# Patient Record
Sex: Female | Born: 1970 | Race: White | Hispanic: No | Smoking: Never smoker
Health system: Southern US, Community
[De-identification: ages and names within clinical notes are randomized; demographics above are authoritative.]

## PROBLEM LIST (undated history)

## (undated) DIAGNOSIS — M1990 Unspecified osteoarthritis, unspecified site: Secondary | ICD-10-CM

## (undated) DIAGNOSIS — K219 Gastro-esophageal reflux disease without esophagitis: Secondary | ICD-10-CM

## (undated) DIAGNOSIS — S329XXA Fracture of unspecified parts of lumbosacral spine and pelvis, initial encounter for closed fracture: Secondary | ICD-10-CM

## (undated) HISTORY — PX: WISDOM TOOTH EXTRACTION: SHX21

---
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
## 2007-01-05 ENCOUNTER — Ambulatory Visit (HOSPITAL_COMMUNITY): Admission: RE | Admit: 2007-01-05 | Discharge: 2007-01-05 | Payer: Self-pay | Admitting: Family Medicine

## 2007-01-12 ENCOUNTER — Ambulatory Visit (HOSPITAL_COMMUNITY): Admission: RE | Admit: 2007-01-12 | Discharge: 2007-01-12 | Payer: Self-pay | Admitting: Family Medicine

## 2009-02-17 ENCOUNTER — Other Ambulatory Visit: Admission: RE | Admit: 2009-02-17 | Discharge: 2009-02-17 | Payer: Self-pay | Admitting: Obstetrics and Gynecology

## 2010-03-08 ENCOUNTER — Emergency Department (HOSPITAL_COMMUNITY)
Admission: EM | Admit: 2010-03-08 | Discharge: 2010-03-08 | Disposition: A | Discharge status: Home / Self Care | Payer: Commercial Managed Care - PPO | Attending: Emergency Medicine | Admitting: Emergency Medicine

## 2010-03-08 DIAGNOSIS — R110 Nausea: Secondary | ICD-10-CM | POA: Insufficient documentation

## 2010-03-08 DIAGNOSIS — R1013 Epigastric pain: Secondary | ICD-10-CM | POA: Insufficient documentation

## 2010-03-08 DIAGNOSIS — K219 Gastro-esophageal reflux disease without esophagitis: Secondary | ICD-10-CM | POA: Insufficient documentation

## 2010-03-08 DIAGNOSIS — R10816 Epigastric abdominal tenderness: Secondary | ICD-10-CM | POA: Insufficient documentation

## 2010-03-08 LAB — COMPREHENSIVE METABOLIC PANEL
ALT: 24 U/L (ref 0–35)
AST: 39 U/L — ABNORMAL HIGH (ref 0–37)
Alkaline Phosphatase: 78 U/L (ref 39–117)
CO2: 24 mEq/L (ref 19–32)
Creatinine, Ser: 0.85 mg/dL (ref 0.4–1.2)
GFR calc Af Amer: >60 mL/min (ref >60)
Total Bilirubin: 0.3 mg/dL (ref 0.3–1.2)

## 2010-03-08 LAB — DIFFERENTIAL
Basophils Relative: 0 % (ref 0–1)
Lymphocytes Relative: 12 % (ref 12–46)
Lymphs Abs: 1.2 10*3/uL (ref 0.7–4.0)
Neutro Abs: 7.6 10*3/uL (ref 1.7–7.7)
Neutrophils Relative %: 81 % — ABNORMAL HIGH (ref 43–77)

## 2010-03-08 LAB — CBC
HCT: 38.5 % (ref 36.0–46.0)
MCH: 29.6 pg (ref 26.0–34.0)
MCHC: 34.3 g/dL (ref 30.0–36.0)
MCV: 86.3 fL (ref 78.0–100.0)
WBC: 9.3 10*3/uL (ref 4.0–10.5)

## 2010-03-08 LAB — POCT CARDIAC MARKERS
CKMB, poc: <1 ng/mL — ABNORMAL LOW (ref 1.0–8.0)
Troponin i, poc: <0.05 ng/mL (ref 0.00–0.09)
Troponin i, poc: <0.05 ng/mL (ref 0.00–0.09)

## 2013-09-05 ENCOUNTER — Other Ambulatory Visit (HOSPITAL_COMMUNITY): Payer: Self-pay | Admitting: Physician Assistant

## 2013-09-05 DIAGNOSIS — R1013 Epigastric pain: Secondary | ICD-10-CM

## 2013-09-12 ENCOUNTER — Ambulatory Visit (HOSPITAL_COMMUNITY)
Admission: RE | Admit: 2013-09-12 | Discharge: 2013-09-12 | Disposition: A | Discharge status: Home / Self Care | Payer: BC Managed Care – PPO | Source: Ambulatory Visit | Attending: Physician Assistant | Admitting: Physician Assistant

## 2013-09-12 DIAGNOSIS — K8020 Calculus of gallbladder without cholecystitis without obstruction: Secondary | ICD-10-CM | POA: Insufficient documentation

## 2013-09-12 DIAGNOSIS — R1013 Epigastric pain: Secondary | ICD-10-CM | POA: Diagnosis present

## 2013-09-12 DIAGNOSIS — R160 Hepatomegaly, not elsewhere classified: Secondary | ICD-10-CM | POA: Insufficient documentation

## 2014-03-25 ENCOUNTER — Other Ambulatory Visit (HOSPITAL_COMMUNITY): Payer: Self-pay | Admitting: Family Medicine

## 2014-03-25 DIAGNOSIS — N23 Unspecified renal colic: Secondary | ICD-10-CM

## 2014-03-25 DIAGNOSIS — K8050 Calculus of bile duct without cholangitis or cholecystitis without obstruction: Secondary | ICD-10-CM

## 2014-03-25 DIAGNOSIS — N2889 Other specified disorders of kidney and ureter: Secondary | ICD-10-CM

## 2014-03-27 ENCOUNTER — Ambulatory Visit (HOSPITAL_COMMUNITY)
Admission: RE | Admit: 2014-03-27 | Discharge: 2014-03-27 | Disposition: A | Discharge status: Home / Self Care | Payer: BLUE CROSS/BLUE SHIELD | Source: Ambulatory Visit | Attending: Family Medicine | Admitting: Family Medicine

## 2014-03-27 DIAGNOSIS — R109 Unspecified abdominal pain: Secondary | ICD-10-CM | POA: Insufficient documentation

## 2014-03-27 DIAGNOSIS — K8020 Calculus of gallbladder without cholecystitis without obstruction: Secondary | ICD-10-CM | POA: Insufficient documentation

## 2014-03-27 DIAGNOSIS — N23 Unspecified renal colic: Secondary | ICD-10-CM

## 2014-03-27 DIAGNOSIS — K8050 Calculus of bile duct without cholangitis or cholecystitis without obstruction: Secondary | ICD-10-CM

## 2014-03-27 DIAGNOSIS — N2889 Other specified disorders of kidney and ureter: Secondary | ICD-10-CM

## 2014-03-27 MED ORDER — IOHEXOL 300 MG/ML  SOLN
100.0000 mL | Freq: Once | INTRAMUSCULAR | Status: AC | PRN
  Administered 2014-03-27: 100 mL via INTRAVENOUS

## 2014-04-17 ENCOUNTER — Encounter (HOSPITAL_COMMUNITY): Payer: Self-pay

## 2014-04-17 ENCOUNTER — Encounter (HOSPITAL_COMMUNITY)
Admission: RE | Admit: 2014-04-17 | Discharge: 2014-04-17 | Disposition: A | Discharge status: Home / Self Care | Payer: BLUE CROSS/BLUE SHIELD | Source: Ambulatory Visit | Attending: General Surgery | Admitting: General Surgery

## 2014-04-17 DIAGNOSIS — Z01812 Encounter for preprocedural laboratory examination: Secondary | ICD-10-CM | POA: Diagnosis present

## 2014-04-17 DIAGNOSIS — K8020 Calculus of gallbladder without cholecystitis without obstruction: Secondary | ICD-10-CM | POA: Diagnosis not present

## 2014-04-17 HISTORY — DX: Gastro-esophageal reflux disease without esophagitis: K21.9

## 2014-04-17 LAB — CBC WITH DIFFERENTIAL/PLATELET
BASOS PCT: 0 % (ref 0–1)
Basophils Absolute: 0 10*3/uL (ref 0.0–0.1)
Eosinophils Absolute: 0.2 10*3/uL (ref 0.0–0.7)
Eosinophils Relative: 3 % (ref 0–5)
HCT: 39.5 % (ref 36.0–46.0)
Hemoglobin: 13.2 g/dL (ref 12.0–15.0)
LYMPHS ABS: 2 10*3/uL (ref 0.7–4.0)
Lymphocytes Relative: 24 % (ref 12–46)
MCH: 29.4 pg (ref 26.0–34.0)
MCHC: 33.4 g/dL (ref 30.0–36.0)
MCV: 88 fL (ref 78.0–100.0)
MONO ABS: 0.6 10*3/uL (ref 0.1–1.0)
MONOS PCT: 7 % (ref 3–12)
Neutro Abs: 5.5 10*3/uL (ref 1.7–7.7)
Neutrophils Relative %: 66 % (ref 43–77)
PLATELETS: 331 10*3/uL (ref 150–400)
RBC: 4.49 MIL/uL (ref 3.87–5.11)
RDW: 12.7 % (ref 11.5–15.5)
WBC: 8.3 10*3/uL (ref 4.0–10.5)

## 2014-04-17 LAB — BASIC METABOLIC PANEL
Anion gap: 8 (ref 5–15)
BUN: 12 mg/dL (ref 6–23)
CALCIUM: 9 mg/dL (ref 8.4–10.5)
CO2: 24 mmol/L (ref 19–32)
Chloride: 105 mmol/L (ref 96–112)
Creatinine, Ser: 0.82 mg/dL (ref 0.50–1.10)
GFR calc Af Amer: >90 mL/min (ref >90)
GFR, EST NON AFRICAN AMERICAN: 88 mL/min — AB (ref >90)
GLUCOSE: 85 mg/dL (ref 70–99)
Potassium: 3.8 mmol/L (ref 3.5–5.1)
SODIUM: 137 mmol/L (ref 135–145)

## 2014-04-17 LAB — HCG, SERUM, QUALITATIVE: Preg, Serum: NEGATIVE

## 2014-04-17 LAB — HEPATIC FUNCTION PANEL
ALK PHOS: 75 U/L (ref 39–117)
ALT: 14 U/L (ref 0–35)
AST: 17 U/L (ref 0–37)
Albumin: 3.9 g/dL (ref 3.5–5.2)
BILIRUBIN TOTAL: 0.5 mg/dL (ref 0.3–1.2)
Total Protein: 7.9 g/dL (ref 6.0–8.3)

## 2014-04-17 NOTE — Patient Instructions (Signed)
Sheryl Dominguez  04/17/2014   Your procedure is scheduled on:  04/23/2014  Report to [facility] at  720  AM.  Call this number if you have problems the morning of surgery: [phone]   Remember:   Do not eat food or drink liquids after midnight.   Take these medicines the morning of surgery with A SIP OF WATER: none   Do not wear jewelry, make-up or nail polish.  Do not wear lotions, powders, or perfumes.   Do not shave 48 hours prior to surgery. Men may shave face and neck.  Do not bring valuables to the hospital.  [facility] is not responsible for any belongings or valuables.               Contacts, dentures or bridgework may not be worn into surgery.  Leave suitcase in the car. After surgery it may be brought to your room.  For patients admitted to the hospital, discharge time is determined by your treatment team.               Patients discharged the day of surgery will not be allowed to drive home.  Name and phone number of your driver: family  Special Instructions: Shower using CHG 2 nights before surgery and the night before surgery.  If you shower the day of surgery use CHG.  Use special wash - you have one bottle of CHG for all showers.  You should use approximately 1/3 of the bottle for each shower.   Please read over the following fact sheets that you were given: Pain Booklet, Coughing and Deep Breathing, Surgical Site Infection Prevention, Anesthesia Post-op Instructions and Care and Recovery After Surgery Laparoscopic Cholecystectomy Laparoscopic cholecystectomy is surgery to remove the gallbladder. The gallbladder is located in the upper right part of the abdomen, behind the liver. It is a storage sac for bile produced in the liver. Bile aids in the digestion and absorption of fats. Cholecystectomy is often done for inflammation of the gallbladder (cholecystitis). This condition is usually caused by a buildup of gallstones (cholelithiasis) in your gallbladder.  Gallstones can block the flow of bile, resulting in inflammation and pain. In severe cases, emergency surgery may be required. When emergency surgery is not required, you will have time to prepare for the procedure. Laparoscopic surgery is an alternative to open surgery. Laparoscopic surgery has a shorter recovery time. Your common bile duct may also need to be examined during the procedure. If stones are found in the common bile duct, they may be removed. LET [facility] KNOW ABOUT:  Any allergies you have.  All medicines you are taking, including vitamins, herbs, eye drops, creams, and over-the-counter medicines.  Previous problems you or members of your family have had with the use of anesthetics.  Any blood disorders you have.  Previous surgeries you have had.  Medical conditions you have. RISKS AND COMPLICATIONS Generally, this is a safe procedure. However, as with any procedure, complications can occur. Possible complications include:  Infection.  Damage to the common bile duct, nerves, arteries, veins, or other internal organs such as the stomach, liver, or intestines.  Bleeding.  A stone may remain in the common bile duct.  A bile leak from the cyst duct that is clipped when your gallbladder is removed.  The need to convert to open surgery, which requires a larger incision in the abdomen. This may be necessary if your surgeon thinks it is not  safe to continue with a laparoscopic procedure. BEFORE THE PROCEDURE  Ask your health care provider about changing or stopping any regular medicines. You will need to stop taking aspirin or blood thinners at least 5 days prior to surgery.  Do not eat or drink anything after midnight the night before surgery.  Let your health care provider know if you develop a cold or other infectious problem before surgery. PROCEDURE   You will be given medicine to make you sleep through the procedure (general anesthetic). A breathing  tube will be placed in your mouth.  When you are asleep, your surgeon will make several small cuts (incisions) in your abdomen.  A thin, lighted tube with a tiny camera on the end (laparoscope) is inserted through one of the small incisions. The camera on the laparoscope sends a picture to a TV screen in the operating room. This gives the surgeon a good view inside your abdomen.  A gas will be pumped into your abdomen. This expands your abdomen so that the surgeon has more room to perform the surgery.  Other tools needed for the procedure are inserted through the other incisions. The gallbladder is removed through one of the incisions.  After the removal of your gallbladder, the incisions will be closed with stitches, staples, or skin glue. AFTER THE PROCEDURE  You will be taken to a recovery area where your progress will be checked often.  You may be allowed to go home the same day if your pain is controlled and you can tolerate liquids. Document Released: 01/10/2005 Document Revised: 10/31/2012 Document Reviewed: 08/22/2012 [facility] Patient Information 2015 [place], [place]. This information is not intended to replace advice given to you by your health care provider. Make sure you discuss any questions you have with your health care provider. PATIENT INSTRUCTIONS POST-ANESTHESIA  IMMEDIATELY FOLLOWING SURGERY:  Do not drive or operate machinery for the first twenty four hours after surgery.  Do not make any important decisions for twenty four hours after surgery or while taking narcotic pain medications or sedatives.  If you develop intractable nausea and vomiting or a severe headache please notify your doctor immediately.  FOLLOW-UP:  Please make an appointment with your surgeon as instructed. You do not need to follow up with anesthesia unless specifically instructed to do so.  WOUND CARE INSTRUCTIONS (if applicable):  Keep a dry clean dressing on the anesthesia/puncture wound site if  there is drainage.  Once the wound has quit draining you may leave it open to air.  Generally you should leave the bandage intact for twenty four hours unless there is drainage.  If the epidural site drains for more than 36-48 hours please call the anesthesia department.  QUESTIONS?:  Please feel free to call your physician or the hospital operator if you have any questions, and they will be happy to assist you.

## 2014-04-21 NOTE — H&P (Signed)
  NTS SOAP Note  Vital Signs:  Vitals as of: 04/17/2014: Systolic 144: Diastolic 88: Heart Rate 85: Temp 98.86F: Height 305ft 10in: Weight 228Lbs 0 Ounces: BMI 32.71  BMI : 32.71 kg/m2  Subjective: This 44 year old female presents for of gallstones.  Has had several episodes of right upper quadrant abdominal pain,  nausea,  bloating,  and fatty food intolerance.  No fever,  chills,  jaundice.  CT scan shows cholelithiasis.  Normal common bile duct.  Review of Symptoms:  Constitutional:unremarkable   Head:unremarkable Eyes:unremarkable   Nose/Mouth/Throat:unremarkable Cardiovascular:  unremarkable Respiratory:unremarkable Gastrointestinabdominal pain, nausea, heartburn Genitourinary:unremarkable   back pain Skin:unremarkable Hematolgic/Lymphatic:unremarkable   Allergic/Immunologic:unremarkable   Past Medical History:  Reviewed  Past Medical History  Surgical History: none Medical Problems: none Allergies: nkda Medications: none   Social History:Reviewed  Social History  Preferred Language: English Race:  White Ethnicity: Not Hispanic / Latino Age: [age] year Marital Status:  M Alcohol: socially   Smoking Status: Never smoker reviewed on 04/17/2014 Functional Status reviewed on 04/17/2014 ------------------------------------------------ Bathing: Normal Cooking: Normal Dressing: Normal Driving: Normal Eating: Normal Managing Meds: Normal Oral Care: Normal Shopping: Normal Toileting: Normal Transferring: Normal Walking: Normal Cognitive Status reviewed on 04/17/2014 ------------------------------------------------ Attention: Normal Decision Making: Normal Language: Normal Memory: Normal Motor: Normal Perception: Normal Problem Solving: Normal Visual and Spatial: Normal   Family History:Reviewed  Family Health History Mother  Father, Living; Cancer unspecified;     Objective Information: Skin:  no rash or prominent  lesions Heart:RRR, no murmur Lungs:  CTA bilaterally, no wheezes, rhonchi, rales.  Breathing unlabored. Abdomen:Soft, NT/ND, no HSM, no masses.  Assessment:gallstones  Diagnoses: 574.20  K80.20 Gallstone (Calculus of gallbladder without cholecystitis without obstruction)  Procedures: [id number] - OFFICE OUTPATIENT NEW 30 MINUTES    Plan:  Scheduled for laparoscopic cholecystectomy on 04/23/14.   Patient Education:Alternative treatments to surgery were discussed with patient (and family).  Risks and benefits  of procedure including bleeding,  infection,  hepatobiliary injury,  and the possibility of an open procedure were fully explained to the patient (and family) who gave informed consent. Patient/family questions were addressed.  Follow-up:Pending Surgery

## 2014-04-23 ENCOUNTER — Ambulatory Visit (HOSPITAL_COMMUNITY): Payer: BLUE CROSS/BLUE SHIELD | Admitting: Anesthesiology

## 2014-04-23 ENCOUNTER — Encounter (HOSPITAL_COMMUNITY): Payer: Self-pay | Admitting: *Deleted

## 2014-04-23 ENCOUNTER — Ambulatory Visit (HOSPITAL_COMMUNITY)
Admission: RE | Admit: 2014-04-23 | Discharge: 2014-04-23 | Disposition: A | Discharge status: Home / Self Care | Payer: BLUE CROSS/BLUE SHIELD | Source: Ambulatory Visit | Attending: General Surgery | Admitting: General Surgery

## 2014-04-23 ENCOUNTER — Encounter (HOSPITAL_COMMUNITY)
Admission: RE | Disposition: A | Discharge status: Home / Self Care | Payer: Self-pay | Source: Ambulatory Visit | Attending: General Surgery

## 2014-04-23 DIAGNOSIS — K8050 Calculus of bile duct without cholangitis or cholecystitis without obstruction: Secondary | ICD-10-CM | POA: Insufficient documentation

## 2014-04-23 HISTORY — DX: Fracture of unspecified parts of lumbosacral spine and pelvis, initial encounter for closed fracture: S32.9XXA

## 2014-04-23 HISTORY — PX: CHOLECYSTECTOMY: SHX55

## 2014-04-23 SURGERY — LAPAROSCOPIC CHOLECYSTECTOMY
Anesthesia: General | Site: Abdomen

## 2014-04-23 MED ORDER — OXYCODONE-ACETAMINOPHEN 7.5-325 MG PO TABS: 1.0000 | ORAL_TABLET | ORAL | Status: DC | PRN

## 2014-04-23 MED ORDER — ONDANSETRON HCL 4 MG/2ML IJ SOLN
INTRAMUSCULAR | Status: AC
  Filled 2014-04-23: qty 2

## 2014-04-23 MED ORDER — POVIDONE-IODINE 10 % EX OINT
TOPICAL_OINTMENT | CUTANEOUS | Status: AC
  Filled 2014-04-23: qty 1

## 2014-04-23 MED ORDER — FENTANYL CITRATE 0.05 MG/ML IJ SOLN
25.0000 ug | INTRAMUSCULAR | Status: DC | PRN
  Administered 2014-04-23: 25 ug via INTRAVENOUS
  Administered 2014-04-23: 50 ug via INTRAVENOUS
  Administered 2014-04-23: 25 ug via INTRAVENOUS

## 2014-04-23 MED ORDER — PROPOFOL 10 MG/ML IV BOLUS
INTRAVENOUS | Status: AC
  Filled 2014-04-23: qty 20

## 2014-04-23 MED ORDER — FENTANYL CITRATE 0.05 MG/ML IJ SOLN
INTRAMUSCULAR | Status: AC
  Filled 2014-04-23: qty 2

## 2014-04-23 MED ORDER — CHLORHEXIDINE GLUCONATE 4 % EX LIQD: 1.0000 "application " | Freq: Once | CUTANEOUS | Status: DC

## 2014-04-23 MED ORDER — CIPROFLOXACIN IN D5W 400 MG/200ML IV SOLN
INTRAVENOUS | Status: AC
  Filled 2014-04-23: qty 200

## 2014-04-23 MED ORDER — FENTANYL CITRATE 0.05 MG/ML IJ SOLN
INTRAMUSCULAR | Status: AC
  Filled 2014-04-23: qty 5

## 2014-04-23 MED ORDER — KETOROLAC TROMETHAMINE 30 MG/ML IJ SOLN
30.0000 mg | Freq: Once | INTRAMUSCULAR | Status: AC
  Administered 2014-04-23: 30 mg via INTRAVENOUS
  Filled 2014-04-23: qty 1

## 2014-04-23 MED ORDER — SODIUM CHLORIDE 0.9 % IR SOLN
Status: DC | PRN
  Administered 2014-04-23: 1000 mL

## 2014-04-23 MED ORDER — BUPIVACAINE HCL (PF) 0.5 % IJ SOLN
INTRAMUSCULAR | Status: DC | PRN
Start: 2014-04-23 — End: 2014-04-23
  Administered 2014-04-23: 10 mL

## 2014-04-23 MED ORDER — LIDOCAINE HCL (PF) 1 % IJ SOLN
INTRAMUSCULAR | Status: AC
  Filled 2014-04-23: qty 5

## 2014-04-23 MED ORDER — MIDAZOLAM HCL 2 MG/2ML IJ SOLN
INTRAMUSCULAR | Status: AC
  Filled 2014-04-23: qty 2

## 2014-04-23 MED ORDER — NEOSTIGMINE METHYLSULFATE 10 MG/10ML IV SOLN
INTRAVENOUS | Status: AC
  Filled 2014-04-23: qty 1

## 2014-04-23 MED ORDER — LIDOCAINE HCL 1 % IJ SOLN
INTRAMUSCULAR | Status: DC | PRN
  Administered 2014-04-23: 40 mg via INTRADERMAL

## 2014-04-23 MED ORDER — NEOSTIGMINE METHYLSULFATE 10 MG/10ML IV SOLN
INTRAVENOUS | Status: DC | PRN
  Administered 2014-04-23: 3 mg via INTRAVENOUS

## 2014-04-23 MED ORDER — PROPOFOL 10 MG/ML IV BOLUS
INTRAVENOUS | Status: DC | PRN
  Administered 2014-04-23: 150 mg via INTRAVENOUS

## 2014-04-23 MED ORDER — POVIDONE-IODINE 10 % OINT PACKET
TOPICAL_OINTMENT | CUTANEOUS | Status: DC | PRN
  Administered 2014-04-23: 1 via TOPICAL

## 2014-04-23 MED ORDER — BUPIVACAINE HCL (PF) 0.5 % IJ SOLN
INTRAMUSCULAR | Status: AC
  Filled 2014-04-23: qty 30

## 2014-04-23 MED ORDER — MIDAZOLAM HCL 2 MG/2ML IJ SOLN
1.0000 mg | INTRAMUSCULAR | Status: AC | PRN
  Administered 2014-04-23 (×3): 2 mg via INTRAVENOUS
  Filled 2014-04-23 (×2): qty 2

## 2014-04-23 MED ORDER — HEMOSTATIC AGENTS (NO CHARGE) OPTIME
TOPICAL | Status: DC | PRN
  Administered 2014-04-23: 1 via TOPICAL

## 2014-04-23 MED ORDER — ROCURONIUM BROMIDE 100 MG/10ML IV SOLN
INTRAVENOUS | Status: DC | PRN
  Administered 2014-04-23: 35 mg via INTRAVENOUS

## 2014-04-23 MED ORDER — GLYCOPYRROLATE 0.2 MG/ML IJ SOLN
INTRAMUSCULAR | Status: AC
  Filled 2014-04-23: qty 3

## 2014-04-23 MED ORDER — ROCURONIUM BROMIDE 50 MG/5ML IV SOLN
INTRAVENOUS | Status: AC
  Filled 2014-04-23: qty 1

## 2014-04-23 MED ORDER — LACTATED RINGERS IV SOLN
INTRAVENOUS | Status: DC
  Administered 2014-04-23: 08:00:00 via INTRAVENOUS

## 2014-04-23 MED ORDER — ONDANSETRON HCL 4 MG/2ML IJ SOLN
4.0000 mg | Freq: Once | INTRAMUSCULAR | Status: AC | PRN
  Administered 2014-04-23: 4 mg via INTRAVENOUS
  Filled 2014-04-23: qty 2

## 2014-04-23 MED ORDER — CIPROFLOXACIN IN D5W 400 MG/200ML IV SOLN
400.0000 mg | INTRAVENOUS | Status: AC
  Administered 2014-04-23: 400 mg via INTRAVENOUS

## 2014-04-23 MED ORDER — GLYCOPYRROLATE 0.2 MG/ML IJ SOLN
INTRAMUSCULAR | Status: DC | PRN
  Administered 2014-04-23: .5 mg via INTRAVENOUS

## 2014-04-23 MED ORDER — ONDANSETRON HCL 4 MG/2ML IJ SOLN
4.0000 mg | Freq: Once | INTRAMUSCULAR | Status: AC
  Administered 2014-04-23: 4 mg via INTRAVENOUS

## 2014-04-23 MED ORDER — FENTANYL CITRATE 0.05 MG/ML IJ SOLN
INTRAMUSCULAR | Status: DC | PRN
  Administered 2014-04-23: 50 ug via INTRAVENOUS
  Administered 2014-04-23: 100 ug via INTRAVENOUS
  Administered 2014-04-23 (×2): 50 ug via INTRAVENOUS

## 2014-04-23 SURGICAL SUPPLY — 44 items
APPLIER CLIP LAPSCP 10X32 DD (CLIP) ×2 IMPLANT
BAG HAMPER (MISCELLANEOUS) ×2 IMPLANT
BAG SPEC RTRVL LRG 6X4 10 (ENDOMECHANICALS) ×1
CHLORAPREP W/TINT 26ML (MISCELLANEOUS) ×2 IMPLANT
CLOTH BEACON ORANGE TIMEOUT ST (SAFETY) ×2 IMPLANT
COVER LIGHT HANDLE STERIS (MISCELLANEOUS) ×4 IMPLANT
DECANTER SPIKE VIAL GLASS SM (MISCELLANEOUS) ×2 IMPLANT
ELECT REM PT RETURN 9FT ADLT (ELECTROSURGICAL) ×2
ELECTRODE REM PT RTRN 9FT ADLT (ELECTROSURGICAL) ×1 IMPLANT
FILTER SMOKE EVAC LAPAROSHD (FILTER) ×2 IMPLANT
FORMALIN 10 PREFIL 120ML (MISCELLANEOUS) ×2 IMPLANT
GLOVE BIOGEL M 7.0 STRL (GLOVE) ×1 IMPLANT
GLOVE BIOGEL PI IND STRL 7.0 (GLOVE) IMPLANT
GLOVE BIOGEL PI INDICATOR 7.0 (GLOVE) ×2
GLOVE ECLIPSE 6.5 STRL STRAW (GLOVE) ×1 IMPLANT
GLOVE EXAM NITRILE LRG STRL (GLOVE) ×1 IMPLANT
GLOVE SURG SS PI 7.5 STRL IVOR (GLOVE) ×2 IMPLANT
GOWN STRL REUS W/ TWL XL LVL3 (GOWN DISPOSABLE) ×1 IMPLANT
GOWN STRL REUS W/TWL LRG LVL3 (GOWN DISPOSABLE) ×4 IMPLANT
GOWN STRL REUS W/TWL XL LVL3 (GOWN DISPOSABLE) ×2
HEMOSTAT SNOW SURGICEL 2X4 (HEMOSTASIS) ×2 IMPLANT
INST SET LAPROSCOPIC AP (KITS) ×2 IMPLANT
IV NS IRRIG 3000ML ARTHROMATIC (IV SOLUTION) IMPLANT
KIT ROOM TURNOVER APOR (KITS) ×2 IMPLANT
MANIFOLD NEPTUNE II (INSTRUMENTS) ×2 IMPLANT
NDL INSUFFLATION 14GA 120MM (NEEDLE) ×1 IMPLANT
NEEDLE INSUFFLATION 14GA 120MM (NEEDLE) ×2 IMPLANT
NS IRRIG 1000ML POUR BTL (IV SOLUTION) ×2 IMPLANT
PACK LAP CHOLE LZT030E (CUSTOM PROCEDURE TRAY) ×2 IMPLANT
PAD ARMBOARD 7.5X6 YLW CONV (MISCELLANEOUS) ×2 IMPLANT
POUCH SPECIMEN RETRIEVAL 10MM (ENDOMECHANICALS) ×2 IMPLANT
SET BASIN LINEN APH (SET/KITS/TRAYS/PACK) ×2 IMPLANT
SET TUBE IRRIG SUCTION NO TIP (IRRIGATION / IRRIGATOR) IMPLANT
SLEEVE ENDOPATH XCEL 5M (ENDOMECHANICALS) ×2 IMPLANT
SPONGE GAUZE 2X2 8PLY STRL LF (GAUZE/BANDAGES/DRESSINGS) ×8 IMPLANT
STAPLER VISISTAT (STAPLE) ×2 IMPLANT
SUT VICRYL 0 UR6 27IN ABS (SUTURE) ×2 IMPLANT
TAPE CLOTH SURG 4X10 WHT LF (GAUZE/BANDAGES/DRESSINGS) ×1 IMPLANT
TROCAR ENDO BLADELESS 11MM (ENDOMECHANICALS) ×2 IMPLANT
TROCAR XCEL NON-BLD 5MMX100MML (ENDOMECHANICALS) ×2 IMPLANT
TROCAR XCEL UNIV SLVE 11M 100M (ENDOMECHANICALS) ×2 IMPLANT
TUBING INSUFFLATION (TUBING) ×2 IMPLANT
WARMER LAPAROSCOPE (MISCELLANEOUS) ×2 IMPLANT
YANKAUER SUCT 12FT TUBE ARGYLE (SUCTIONS) ×2 IMPLANT

## 2014-04-23 NOTE — Op Note (Signed)
Patient:  Nena Polioina R Colasurdo  DOB:  [birth year]  MRN:  [id number]   Preop Diagnosis:  Biliary colic, cholelithiasis  Postop Diagnosis:  Same  Procedure:  Laparoscopic cholecystectomy  Surgeon:  Franky MachoMark Klint Lezcano, M.D.  Anes:  Gen. endotracheal  Indications:  Patient is a 44 year old white female who presents with biliary colic secondary to cholelithiasis. Risks and benefits of the procedure including bleeding, infection, hepatobiliary injury, the possibility of an open procedure were fully explained to the patient, who gave informed consent.  Procedure note:  The patient was placed in the supine position. After induction of general endotracheal anesthesia, the abdomen was prepped and draped using the usual sterile technique with DuraPrep. Surgical site confirmation was performed.  A supraumbilical incision was made down the fascia. A Veress needle was introduced into the abdominal cavity and confirmation of placement was done using the saline drop test. The abdomen was then insufflated to 16 mmHg pressure. 11 mm trocar was introduced the abdominal cavity under direct visualization without difficulty. The patient was placed in reverse Trendelenburg position and an additional 11 mm trocar was placed the epigastric region and 5 mm trochars were placed the right upper quadrant and right flank regions. The liver was inspected and noted to be within normal limits. The gallbladder was retracted in a dynamic fashion in order to expose the triangle of Calot. The cystic duct was first identified. Its juncture to the infundibulum was fully identified. Endoclips were placed proximally and distally on the cystic duct, and the cystic duct was divided. This was likewise done to the cystic artery. The gallbladder was freed away from the gallbladder fossa using Bovie electrocautery. The gallbladder was delivered through the epigastric trocar site using Endo Catch bag. The gallbladder fossa was inspected and no abnormal  bleeding or bile leakage was noted. Surgicel was placed the gallbladder fossa. All fluid and air were then evacuated from the abdominal cavity prior to removal of the trochars.  All wounds were irrigated with normal saline. All wounds were injected with 0.5% Sensorcaine. The supraumbilical fascia as well as epigastric fascia reapproximated using 0 Vicryl interrupted sutures. All skin incisions were closed using staples. Betadine ointment and dry sterile dressings were applied.  All tape and needle counts were correct at the end of the procedure. The patient was extubated in the operating room and transferred to PACU in stable condition.  Complications:  None  EBL:  Minimal  Specimen:  Gallbladder

## 2014-04-23 NOTE — Anesthesia Postprocedure Evaluation (Signed)
  Anesthesia Post-op Note  Patient: Sheryl Dominguez  Procedure(s) Performed: Procedure(s): LAPAROSCOPIC CHOLECYSTECTOMY (N/A)  Patient Location: PACU  Anesthesia Type:General  Level of Consciousness: awake, alert  and oriented  Airway and Oxygen Therapy: Patient Spontanous Breathing  Post-op Pain: none  Post-op Assessment: Post-op Vital signs reviewed, Patient's Cardiovascular Status Stable, Respiratory Function Stable, Patent Airway and No signs of Nausea or vomiting  Post-op Vital Signs: Reviewed and stable  Last Vitals:  Filed Vitals:   04/23/14 1005  BP:   Pulse:   Temp:   Resp: 12    Complications: No apparent anesthesia complications

## 2014-04-23 NOTE — Transfer of Care (Signed)
Immediate Anesthesia Transfer of Care Note  Patient: Nena Polioina R Mckiddy  Procedure(s) Performed: Procedure(s): LAPAROSCOPIC CHOLECYSTECTOMY (N/A)  Patient Location: PACU  Anesthesia Type:General  Level of Consciousness: awake, alert  and oriented  Airway & Oxygen Therapy: Patient Spontanous Breathing and Patient connected to face mask oxygen  Post-op Assessment: Report given to RN  Post vital signs: Reviewed and stable  Last Vitals:  Filed Vitals:   04/23/14 0900  BP: 104/45  Pulse:   Temp:   Resp: 15    Complications: No apparent anesthesia complications

## 2014-04-23 NOTE — Anesthesia Preprocedure Evaluation (Signed)
Anesthesia Evaluation  Patient identified by MRN, date of birth, ID band Patient awake    Reviewed: Allergy & Precautions, NPO status , Patient's Chart, lab work & pertinent test results  Airway Mallampati: II  TM Distance: >3 FB     Dental  (+) Teeth Intact   Pulmonary neg pulmonary ROS,  breath sounds clear to auscultation        Cardiovascular negative cardio ROS  Rhythm:Regular Rate:Normal     Neuro/Psych    GI/Hepatic GERD-  Medicated,  Endo/Other    Renal/GU      Musculoskeletal   Abdominal   Peds  Hematology   Anesthesia Other Findings   Reproductive/Obstetrics                             Anesthesia Physical Anesthesia Plan  ASA: II  Anesthesia Plan: General   Post-op Pain Management:    Induction: Intravenous  Airway Management Planned: Oral ETT  Additional Equipment:   Intra-op Plan:   Post-operative Plan: Extubation in OR  Informed Consent: I have reviewed the patients History and Physical, chart, labs and discussed the procedure including the risks, benefits and alternatives for the proposed anesthesia with the patient or authorized representative who has indicated his/her understanding and acceptance.     Plan Discussed with:   Anesthesia Plan Comments:         Anesthesia Quick Evaluation

## 2014-04-23 NOTE — Interval H&P Note (Signed)
History and Physical Interval Note:  04/23/2014 8:17 AM  Sheryl Dominguez  has presented today for surgery, with the diagnosis of cholelithiasis  The various methods of treatment have been discussed with the patient and family. After consideration of risks, benefits and other options for treatment, the patient has consented to  Procedure(s): LAPAROSCOPIC CHOLECYSTECTOMY (N/A) as a surgical intervention .  The patient's history has been reviewed, patient examined, no change in status, stable for surgery.  I have reviewed the patient's chart and labs.  Questions were answered to the patient's satisfaction.     Franky MachoJENKINS,Estill Llerena A

## 2014-04-23 NOTE — Discharge Instructions (Signed)

## 2014-04-23 NOTE — Anesthesia Procedure Notes (Signed)
Procedure Name: Intubation Date/Time: 04/23/2014 9:17 AM Performed by: Glynn OctaveANIEL, Kameren Baade E Pre-anesthesia Checklist: Patient identified, Patient being monitored, Timeout performed, Emergency Drugs available and Suction available Patient Re-evaluated:Patient Re-evaluated prior to inductionOxygen Delivery Method: Circle System Utilized Preoxygenation: Pre-oxygenation with 100% oxygen Intubation Type: IV induction Ventilation: Mask ventilation without difficulty Laryngoscope Size: Mac and 3 Grade View: Grade I Tube type: Oral Tube size: 7.0 mm Number of attempts: 1 Airway Equipment and Method: Stylet Placement Confirmation: ETT inserted through vocal cords under direct vision,  positive ETCO2 and breath sounds checked- equal and bilateral Secured at: 21 cm Tube secured with: Tape Dental Injury: Teeth and Oropharynx as per pre-operative assessment

## 2014-04-25 ENCOUNTER — Encounter (HOSPITAL_COMMUNITY): Payer: Self-pay | Admitting: General Surgery

## 2014-07-23 ENCOUNTER — Other Ambulatory Visit (HOSPITAL_COMMUNITY): Payer: Self-pay | Admitting: Family Medicine

## 2014-07-23 DIAGNOSIS — Z1231 Encounter for screening mammogram for malignant neoplasm of breast: Secondary | ICD-10-CM

## 2014-08-11 ENCOUNTER — Ambulatory Visit (HOSPITAL_COMMUNITY)
Admission: RE | Admit: 2014-08-11 | Discharge: 2014-08-11 | Disposition: A | Discharge status: Home / Self Care | Payer: BLUE CROSS/BLUE SHIELD | Source: Ambulatory Visit | Attending: Family Medicine | Admitting: Family Medicine

## 2014-08-11 DIAGNOSIS — Z1231 Encounter for screening mammogram for malignant neoplasm of breast: Secondary | ICD-10-CM | POA: Insufficient documentation

## 2015-05-22 DIAGNOSIS — E663 Overweight: Secondary | ICD-10-CM | POA: Diagnosis not present

## 2015-05-22 DIAGNOSIS — J209 Acute bronchitis, unspecified: Secondary | ICD-10-CM | POA: Diagnosis not present

## 2015-05-22 DIAGNOSIS — Z1389 Encounter for screening for other disorder: Secondary | ICD-10-CM | POA: Diagnosis not present

## 2015-05-22 DIAGNOSIS — J069 Acute upper respiratory infection, unspecified: Secondary | ICD-10-CM | POA: Diagnosis not present

## 2015-05-22 DIAGNOSIS — Z6828 Body mass index (BMI) 28.0-28.9, adult: Secondary | ICD-10-CM | POA: Diagnosis not present

## 2015-09-15 DIAGNOSIS — Z0000 Encounter for general adult medical examination without abnormal findings: Secondary | ICD-10-CM | POA: Diagnosis not present

## 2015-09-15 DIAGNOSIS — E663 Overweight: Secondary | ICD-10-CM | POA: Diagnosis not present

## 2015-09-15 DIAGNOSIS — Z1389 Encounter for screening for other disorder: Secondary | ICD-10-CM | POA: Diagnosis not present

## 2015-09-15 DIAGNOSIS — E782 Mixed hyperlipidemia: Secondary | ICD-10-CM | POA: Diagnosis not present

## 2015-09-15 DIAGNOSIS — Z6827 Body mass index (BMI) 27.0-27.9, adult: Secondary | ICD-10-CM | POA: Diagnosis not present

## 2015-11-07 DIAGNOSIS — Z23 Encounter for immunization: Secondary | ICD-10-CM | POA: Diagnosis not present

## 2016-03-31 DIAGNOSIS — Z1389 Encounter for screening for other disorder: Secondary | ICD-10-CM | POA: Diagnosis not present

## 2016-03-31 DIAGNOSIS — J329 Chronic sinusitis, unspecified: Secondary | ICD-10-CM | POA: Diagnosis not present

## 2016-03-31 DIAGNOSIS — J042 Acute laryngotracheitis: Secondary | ICD-10-CM | POA: Diagnosis not present

## 2016-03-31 DIAGNOSIS — Z6827 Body mass index (BMI) 27.0-27.9, adult: Secondary | ICD-10-CM | POA: Diagnosis not present

## 2016-08-15 ENCOUNTER — Other Ambulatory Visit (HOSPITAL_COMMUNITY)
Admission: RE | Admit: 2016-08-15 | Discharge: 2016-08-15 | Disposition: A | Discharge status: Home / Self Care | Payer: BLUE CROSS/BLUE SHIELD | Source: Ambulatory Visit | Attending: Obstetrics and Gynecology | Admitting: Obstetrics and Gynecology

## 2016-08-15 ENCOUNTER — Other Ambulatory Visit: Payer: Self-pay | Admitting: Obstetrics and Gynecology

## 2016-08-15 DIAGNOSIS — Z113 Encounter for screening for infections with a predominantly sexual mode of transmission: Secondary | ICD-10-CM | POA: Diagnosis not present

## 2016-08-15 DIAGNOSIS — N72 Inflammatory disease of cervix uteri: Secondary | ICD-10-CM | POA: Diagnosis not present

## 2016-08-15 DIAGNOSIS — N920 Excessive and frequent menstruation with regular cycle: Secondary | ICD-10-CM | POA: Diagnosis not present

## 2016-08-15 DIAGNOSIS — R87810 Cervical high risk human papillomavirus (HPV) DNA test positive: Secondary | ICD-10-CM | POA: Diagnosis not present

## 2016-08-15 DIAGNOSIS — Z124 Encounter for screening for malignant neoplasm of cervix: Secondary | ICD-10-CM | POA: Insufficient documentation

## 2016-08-15 DIAGNOSIS — N930 Postcoital and contact bleeding: Secondary | ICD-10-CM | POA: Diagnosis not present

## 2016-08-15 DIAGNOSIS — Z01411 Encounter for gynecological examination (general) (routine) with abnormal findings: Secondary | ICD-10-CM | POA: Diagnosis not present

## 2016-08-17 LAB — CYTOLOGY - PAP
Diagnosis: NEGATIVE
HPV (WINDOPATH): DETECTED — AB
HPV 16/18/45 GENOTYPING: NEGATIVE

## 2016-09-08 DIAGNOSIS — A749 Chlamydial infection, unspecified: Secondary | ICD-10-CM | POA: Diagnosis not present

## 2016-09-08 DIAGNOSIS — N920 Excessive and frequent menstruation with regular cycle: Secondary | ICD-10-CM | POA: Diagnosis not present

## 2016-09-08 DIAGNOSIS — D252 Subserosal leiomyoma of uterus: Secondary | ICD-10-CM | POA: Diagnosis not present

## 2016-11-07 DIAGNOSIS — L292 Pruritus vulvae: Secondary | ICD-10-CM | POA: Diagnosis not present

## 2016-12-06 DIAGNOSIS — Z6829 Body mass index (BMI) 29.0-29.9, adult: Secondary | ICD-10-CM | POA: Diagnosis not present

## 2016-12-06 DIAGNOSIS — N23 Unspecified renal colic: Secondary | ICD-10-CM | POA: Diagnosis not present

## 2016-12-06 DIAGNOSIS — Z1389 Encounter for screening for other disorder: Secondary | ICD-10-CM | POA: Diagnosis not present

## 2016-12-06 DIAGNOSIS — Z23 Encounter for immunization: Secondary | ICD-10-CM | POA: Diagnosis not present

## 2016-12-06 DIAGNOSIS — E782 Mixed hyperlipidemia: Secondary | ICD-10-CM | POA: Diagnosis not present

## 2016-12-06 DIAGNOSIS — Z0001 Encounter for general adult medical examination with abnormal findings: Secondary | ICD-10-CM | POA: Diagnosis not present

## 2017-06-06 DIAGNOSIS — J069 Acute upper respiratory infection, unspecified: Secondary | ICD-10-CM | POA: Diagnosis not present

## 2017-06-06 DIAGNOSIS — Z1389 Encounter for screening for other disorder: Secondary | ICD-10-CM | POA: Diagnosis not present

## 2017-06-06 DIAGNOSIS — Z6829 Body mass index (BMI) 29.0-29.9, adult: Secondary | ICD-10-CM | POA: Diagnosis not present

## 2017-06-06 DIAGNOSIS — E663 Overweight: Secondary | ICD-10-CM | POA: Diagnosis not present

## 2017-11-13 DIAGNOSIS — Z23 Encounter for immunization: Secondary | ICD-10-CM | POA: Diagnosis not present

## 2017-12-14 DIAGNOSIS — Z1389 Encounter for screening for other disorder: Secondary | ICD-10-CM | POA: Diagnosis not present

## 2017-12-14 DIAGNOSIS — E6609 Other obesity due to excess calories: Secondary | ICD-10-CM | POA: Diagnosis not present

## 2017-12-14 DIAGNOSIS — Z6830 Body mass index (BMI) 30.0-30.9, adult: Secondary | ICD-10-CM | POA: Diagnosis not present

## 2017-12-14 DIAGNOSIS — Z0001 Encounter for general adult medical examination with abnormal findings: Secondary | ICD-10-CM | POA: Diagnosis not present

## 2017-12-14 DIAGNOSIS — R0602 Shortness of breath: Secondary | ICD-10-CM | POA: Diagnosis not present

## 2017-12-19 ENCOUNTER — Other Ambulatory Visit: Payer: Self-pay | Admitting: Family Medicine

## 2017-12-19 DIAGNOSIS — Z1231 Encounter for screening mammogram for malignant neoplasm of breast: Secondary | ICD-10-CM

## 2018-02-02 ENCOUNTER — Ambulatory Visit
Admission: RE | Admit: 2018-02-02 | Discharge: 2018-02-02 | Disposition: A | Discharge status: Home / Self Care | Payer: BLUE CROSS/BLUE SHIELD | Source: Ambulatory Visit | Attending: Family Medicine | Admitting: Family Medicine

## 2018-02-02 DIAGNOSIS — Z1231 Encounter for screening mammogram for malignant neoplasm of breast: Secondary | ICD-10-CM

## 2018-06-21 DIAGNOSIS — Z1389 Encounter for screening for other disorder: Secondary | ICD-10-CM | POA: Diagnosis not present

## 2018-06-21 DIAGNOSIS — Z6832 Body mass index (BMI) 32.0-32.9, adult: Secondary | ICD-10-CM | POA: Diagnosis not present

## 2018-06-21 DIAGNOSIS — E6609 Other obesity due to excess calories: Secondary | ICD-10-CM | POA: Diagnosis not present

## 2018-06-21 DIAGNOSIS — M7551 Bursitis of right shoulder: Secondary | ICD-10-CM | POA: Diagnosis not present

## 2018-12-28 DIAGNOSIS — E6609 Other obesity due to excess calories: Secondary | ICD-10-CM | POA: Diagnosis not present

## 2018-12-28 DIAGNOSIS — Z6833 Body mass index (BMI) 33.0-33.9, adult: Secondary | ICD-10-CM | POA: Diagnosis not present

## 2018-12-28 DIAGNOSIS — E7849 Other hyperlipidemia: Secondary | ICD-10-CM | POA: Diagnosis not present

## 2018-12-28 DIAGNOSIS — N2889 Other specified disorders of kidney and ureter: Secondary | ICD-10-CM | POA: Diagnosis not present

## 2018-12-28 DIAGNOSIS — Z0001 Encounter for general adult medical examination with abnormal findings: Secondary | ICD-10-CM | POA: Diagnosis not present

## 2018-12-28 DIAGNOSIS — Z1389 Encounter for screening for other disorder: Secondary | ICD-10-CM | POA: Diagnosis not present

## 2018-12-28 DIAGNOSIS — E559 Vitamin D deficiency, unspecified: Secondary | ICD-10-CM | POA: Diagnosis not present

## 2018-12-28 DIAGNOSIS — Z23 Encounter for immunization: Secondary | ICD-10-CM | POA: Diagnosis not present

## 2019-06-14 DIAGNOSIS — E6609 Other obesity due to excess calories: Secondary | ICD-10-CM | POA: Diagnosis not present

## 2019-06-14 DIAGNOSIS — F419 Anxiety disorder, unspecified: Secondary | ICD-10-CM | POA: Diagnosis not present

## 2019-06-14 DIAGNOSIS — Z6832 Body mass index (BMI) 32.0-32.9, adult: Secondary | ICD-10-CM | POA: Diagnosis not present

## 2019-06-14 DIAGNOSIS — Z1389 Encounter for screening for other disorder: Secondary | ICD-10-CM | POA: Diagnosis not present

## 2019-08-19 DIAGNOSIS — R87810 Cervical high risk human papillomavirus (HPV) DNA test positive: Secondary | ICD-10-CM | POA: Diagnosis not present

## 2019-08-19 DIAGNOSIS — Z3202 Encounter for pregnancy test, result negative: Secondary | ICD-10-CM | POA: Diagnosis not present

## 2019-10-08 ENCOUNTER — Other Ambulatory Visit: Payer: Self-pay | Admitting: Obstetrics and Gynecology

## 2019-10-08 DIAGNOSIS — Z1231 Encounter for screening mammogram for malignant neoplasm of breast: Secondary | ICD-10-CM

## 2019-10-21 ENCOUNTER — Other Ambulatory Visit: Payer: Self-pay

## 2019-10-21 ENCOUNTER — Ambulatory Visit
Admission: RE | Admit: 2019-10-21 | Discharge: 2019-10-21 | Disposition: A | Discharge status: Home / Self Care | Payer: BLUE CROSS/BLUE SHIELD | Source: Ambulatory Visit | Attending: Obstetrics and Gynecology | Admitting: Obstetrics and Gynecology

## 2019-10-21 DIAGNOSIS — Z1231 Encounter for screening mammogram for malignant neoplasm of breast: Secondary | ICD-10-CM

## 2020-09-10 ENCOUNTER — Other Ambulatory Visit: Payer: Self-pay | Admitting: Obstetrics and Gynecology

## 2020-09-10 DIAGNOSIS — Z1231 Encounter for screening mammogram for malignant neoplasm of breast: Secondary | ICD-10-CM

## 2020-10-21 ENCOUNTER — Ambulatory Visit
Admission: RE | Admit: 2020-10-21 | Discharge: 2020-10-21 | Disposition: A | Discharge status: Home / Self Care | Payer: No Typology Code available for payment source | Source: Ambulatory Visit | Attending: Obstetrics and Gynecology | Admitting: Obstetrics and Gynecology

## 2020-10-21 ENCOUNTER — Other Ambulatory Visit: Payer: Self-pay

## 2020-10-21 DIAGNOSIS — Z1231 Encounter for screening mammogram for malignant neoplasm of breast: Secondary | ICD-10-CM

## 2020-10-27 ENCOUNTER — Other Ambulatory Visit: Payer: Self-pay | Admitting: Obstetrics and Gynecology

## 2020-10-27 DIAGNOSIS — R928 Other abnormal and inconclusive findings on diagnostic imaging of breast: Secondary | ICD-10-CM

## 2020-11-09 ENCOUNTER — Other Ambulatory Visit: Payer: Self-pay | Admitting: Obstetrics and Gynecology

## 2020-11-09 ENCOUNTER — Other Ambulatory Visit: Payer: Self-pay

## 2020-11-09 ENCOUNTER — Ambulatory Visit
Admission: RE | Admit: 2020-11-09 | Discharge: 2020-11-09 | Disposition: A | Discharge status: Home / Self Care | Payer: No Typology Code available for payment source | Source: Ambulatory Visit | Attending: Obstetrics and Gynecology | Admitting: Obstetrics and Gynecology

## 2020-11-09 DIAGNOSIS — R921 Mammographic calcification found on diagnostic imaging of breast: Secondary | ICD-10-CM

## 2020-11-09 DIAGNOSIS — R928 Other abnormal and inconclusive findings on diagnostic imaging of breast: Secondary | ICD-10-CM

## 2020-11-18 ENCOUNTER — Ambulatory Visit
Admission: RE | Admit: 2020-11-18 | Discharge: 2020-11-18 | Disposition: A | Discharge status: Home / Self Care | Payer: No Typology Code available for payment source | Source: Ambulatory Visit | Attending: Obstetrics and Gynecology | Admitting: Obstetrics and Gynecology

## 2020-11-18 ENCOUNTER — Other Ambulatory Visit: Payer: Self-pay | Admitting: Obstetrics and Gynecology

## 2020-11-18 ENCOUNTER — Other Ambulatory Visit: Payer: Self-pay

## 2020-11-18 DIAGNOSIS — R921 Mammographic calcification found on diagnostic imaging of breast: Secondary | ICD-10-CM

## 2020-11-19 HISTORY — PX: BREAST BIOPSY: SHX20

## 2020-11-24 DIAGNOSIS — J40 Bronchitis, not specified as acute or chronic: Secondary | ICD-10-CM

## 2020-11-24 HISTORY — DX: Bronchitis, not specified as acute or chronic: J40

## 2020-12-02 ENCOUNTER — Ambulatory Visit: Payer: Self-pay | Admitting: General Surgery

## 2020-12-02 DIAGNOSIS — N6091 Unspecified benign mammary dysplasia of right breast: Secondary | ICD-10-CM

## 2020-12-09 ENCOUNTER — Telehealth: Payer: Self-pay | Admitting: Hematology

## 2020-12-09 NOTE — Telephone Encounter (Signed)
Scheduled appt. Pt aware.

## 2021-01-07 ENCOUNTER — Telehealth: Payer: Self-pay | Admitting: Hematology and Oncology

## 2021-01-07 NOTE — Telephone Encounter (Signed)
Pt requested to r/s her new high risk appt. I r/s her appt, she is aware of new appt date and time.

## 2021-01-08 ENCOUNTER — Encounter: Payer: No Typology Code available for payment source | Admitting: Hematology

## 2021-01-24 DIAGNOSIS — N6091 Unspecified benign mammary dysplasia of right breast: Secondary | ICD-10-CM

## 2021-01-24 HISTORY — DX: Unspecified benign mammary dysplasia of right breast: N60.91

## 2021-01-28 ENCOUNTER — Other Ambulatory Visit: Payer: Self-pay

## 2021-01-28 ENCOUNTER — Encounter: Payer: Self-pay | Admitting: Hematology and Oncology

## 2021-01-28 ENCOUNTER — Other Ambulatory Visit: Payer: Self-pay | Admitting: General Surgery

## 2021-01-28 ENCOUNTER — Inpatient Hospital Stay: Payer: No Typology Code available for payment source | Attending: Hematology | Admitting: Hematology and Oncology

## 2021-01-28 VITALS — BP 120/74 | HR 78 | Temp 97.7°F | Resp 16 | Ht 71.0 in | Wt 210.5 lb

## 2021-01-28 DIAGNOSIS — Z803 Family history of malignant neoplasm of breast: Secondary | ICD-10-CM | POA: Diagnosis not present

## 2021-01-28 DIAGNOSIS — N6091 Unspecified benign mammary dysplasia of right breast: Secondary | ICD-10-CM | POA: Diagnosis present

## 2021-01-28 NOTE — Progress Notes (Signed)
[facility] CONSULT NOTE  Patient Care Team: Assunta FoundGolding, John, MD as PCP - General (Family Medicine)  CHIEF COMPLAINTS/PURPOSE OF CONSULTATION:  Atypical ductal hyperplasia of right breast  ASSESSMENT & PLAN:   This is a very pleasant 51 year old Caucasian female patient with no significant past medical history except for abnormal Pap smear status post cervical LEEP procedure referred to medical oncology, high risk breast cancer clinic given her recent diagnosis of atypical ductal hyperplasia of the right breast.  We have discussed the following findings with the patient today.  Pathology review: I discussed the difference between atypical ductal hyperplasia, DCIS and invasive breast cancer. I explained to her that atypical ductal hyperplasia  is characterized by a proliferation of uniform epithelial cells filling part, but not the entirety, of the involved duct. ADH is associated with an increased risk of both ipsilateral and contralateral breast cancer and thus provides evidence of underlying breast abnormalities that predispose to breast cancer.  I discussed the risks and benefits of tamoxifen therapy.  I have discussed mechanism of action of tamoxifen, adverse effects of tamoxifen including but not limited to postmenopausal symptoms, increased risk of DVT/PE, endometrial cancer, increased risk of cardiovascular events.  We have discussed about trying low-dose tamoxifen based on TAM 01 study and we have discussed about no survival benefit with tamoxifen.  We have also discussed the following.  1. Exercise 30 minutes daily or 150 minutes a week. 2. Weight loss 3. Increasing fruits and vegetables and less red meat I believe all of the above would extensively decrease her risk of breast cancer as well as other cancers including cancers of the colon substantially.  Surveillance plan: Annual mammograms and breast exams  At this time, we have discussed that there is no definitive  role of MRI screening for atypical ductal hyperplasia however this was offered to the patient.  We have discussed about long-term unknown toxicity of gadolinium and increased sensitivity of MRI which may occasionally lead to more biopsies.  She is okay with continuing mammograms annually for monitoring at this time.  She is willing to try tamoxifen.  I have encouraged her to return to clinic after her surgery.  She is agreeable to all these recommendations.  HISTORY OF PRESENTING ILLNESS:  Sheryl Dominguez 51 y.o. female is here because of atypical ductal hyperplasia of right breast Sheryl Dominguez is a 51 year old female patient who went for routine screening mammogram.  She was found to have a 3 mm area of calcification in the lower outer right breast.  This was biopsied and came back as atypical ductal hyperplasia.  She does have family history of breast cancer in a paternal grandmother.  No other pertinent past medical history She had cervical biopsy with loop electrode excision in her past surgical history She is a never smoker she was seen by Dr. Carolynne Edouardoth from a breast surgery, scheduled for surgical removal.  Since atypical ductal hyperplasia increases her lifetime risk of breast cancer, she was referred to medical oncology for consideration of endocrine prevention.  She recently had an abnormal Pap smear and had cervical LEEP procedure last year.  She also reports head and neck cancer in her dad, likely from chewing tobacco.  Rest of the pertinent 10 point ROS reviewed and negative.  REVIEW OF SYSTEMS:   Constitutional: Denies fevers, chills or abnormal night sweats Eyes: Denies blurriness of vision, double vision or watery eyes Ears, nose, mouth, throat, and face: Denies mucositis or sore throat Respiratory: Denies cough,  dyspnea or wheezes Cardiovascular: Denies palpitation, chest discomfort or lower extremity swelling Gastrointestinal:  Denies nausea, heartburn or change in bowel habits Skin:  Denies abnormal skin rashes Lymphatics: Denies new lymphadenopathy or easy bruising Neurological:Denies numbness, tingling or new weaknesses Behavioral/Psych: Mood is stable, no new changes  All other systems were reviewed with the patient and are negative.  MEDICAL HISTORY:  Past Medical History:  Diagnosis Date   GERD (gastroesophageal reflux disease)    Pelvic fracture (HCC) 1990    SURGICAL HISTORY: Past Surgical History:  Procedure Laterality Date   CHOLECYSTECTOMY N/A 04/23/2014   Procedure: LAPAROSCOPIC CHOLECYSTECTOMY;  Surgeon: Franky Macho Md, MD;  Location: [facility];  Service: General;  Laterality: N/A;   WISDOM TOOTH EXTRACTION      SOCIAL HISTORY: Social History   Socioeconomic History   Marital status: Divorced    Spouse name: Not on file   Number of children: Not on file   Years of education: Not on file   Highest education level: Not on file  Occupational History   Not on file  Tobacco Use   Smoking status: Never   Smokeless tobacco: Not on file  Substance and Sexual Activity   Alcohol use: Yes    Comment: occ   Drug use: No   Sexual activity: Not on file  Other Topics Concern   Not on file  Social History Narrative   Not on file   Social Determinants of Health   Financial Resource Strain: Not on file  Food Insecurity: Not on file  Transportation Needs: Not on file  Physical Activity: Not on file  Stress: Not on file  Social Connections: Not on file  Intimate Partner Violence: Not on file    FAMILY HISTORY: Family History  Problem Relation Age of Onset   Breast cancer Paternal Grandmother     ALLERGIES:  has No Known Allergies.  MEDICATIONS:  Current Outpatient Medications  Medication Sig Dispense Refill   ibuprofen (ADVIL,MOTRIN) 200 MG tablet Take 800 mg by mouth every 6 (six) hours as needed for moderate pain.     oxyCODONE-acetaminophen (PERCOCET) 7.5-325 MG per tablet Take 1-2 tablets by mouth every 4 (four) hours as needed. 50  tablet 0   No current facility-administered medications for this visit.     PHYSICAL EXAMINATION: ECOG PERFORMANCE STATUS: 0 - Asymptomatic  Vitals:   01/28/21 0952  BP: 120/74  Pulse: 78  Resp: 16  Temp: 97.7 F (36.5 C)  SpO2: 100%   Filed Weights   01/28/21 0952  Weight: 210 lb 8 oz (95.5 kg)    GENERAL:alert, no distress and comfortable SKIN: skin color, texture, turgor are normal, no rashes or significant lesions EYES: normal, conjunctiva are pink and non-injected, sclera clear OROPHARYNX:no exudate, no erythema and lips, buccal mucosa, and tongue normal  NECK: supple, thyroid normal size, non-tender, without nodularity LYMPH:  no palpable lymphadenopathy in the cervical, axillary  LUNGS: clear to auscultation and percussion with normal breathing effort HEART: regular rate & rhythm and no murmurs and no lower extremity edema ABDOMEN:abdomen soft, non-tender and normal bowel sounds Musculoskeletal:no cyanosis of digits and no clubbing  PSYCH: alert & oriented x 3 with fluent speech NEURO: no focal motor/sensory deficits Breast exam deferred today, recently had a breast exam by Dr. Carolynne Edouard.  LABORATORY DATA:  I have reviewed the data as listed Lab Results  Component Value Date   WBC 8.3 04/17/2014   HGB 13.2 04/17/2014   HCT 39.5 04/17/2014   MCV 88.0  04/17/2014   PLT 331 04/17/2014     Chemistry      Component Value Date/Time   NA 137 04/17/2014 1405   K 3.8 04/17/2014 1405   CL 105 04/17/2014 1405   CO2 24 04/17/2014 1405   BUN 12 04/17/2014 1405   CREATININE 0.82 04/17/2014 1405      Component Value Date/Time   CALCIUM 9.0 04/17/2014 1405   ALKPHOS 75 04/17/2014 1405   AST 17 04/17/2014 1405   ALT 14 04/17/2014 1405   BILITOT 0.5 04/17/2014 1405     Right breast needle core biopsy, lateral, 8 to 9 o'clock position Atypical ductal hyperplasia with calcifications Usual ductal hyperplasia and fibrocystic changes.  RADIOGRAPHIC STUDIES: I have  personally reviewed the radiological images as listed and agreed with the findings in the report. No results found.  All questions were answered. The patient knows to call the clinic with any problems, questions or concerns. I spent 60 minutes in the care of this patient including H and P, review of records, counseling and coordination of care. Please refer to the assessment and plan for details. Thank you for consulting [place] in the care of this patient.  Please do not hesitate to contact us with any additional questions or concerns    Rachel Moulds, MD 01/28/2021 10:06 AM

## 2021-02-17 ENCOUNTER — Encounter (HOSPITAL_BASED_OUTPATIENT_CLINIC_OR_DEPARTMENT_OTHER): Payer: Self-pay | Admitting: General Surgery

## 2021-02-17 ENCOUNTER — Other Ambulatory Visit: Payer: Self-pay

## 2021-02-25 ENCOUNTER — Ambulatory Visit
Admission: RE | Admit: 2021-02-25 | Discharge: 2021-02-25 | Disposition: A | Discharge status: Home / Self Care | Payer: No Typology Code available for payment source | Source: Ambulatory Visit | Attending: General Surgery | Admitting: General Surgery

## 2021-02-25 DIAGNOSIS — N6091 Unspecified benign mammary dysplasia of right breast: Secondary | ICD-10-CM

## 2021-02-25 NOTE — Progress Notes (Signed)
Sent text reminding pt to come pick up pre surgery drink and soap.  °

## 2021-02-25 NOTE — Progress Notes (Signed)

## 2021-02-26 ENCOUNTER — Other Ambulatory Visit: Payer: Self-pay

## 2021-02-26 ENCOUNTER — Ambulatory Visit (HOSPITAL_BASED_OUTPATIENT_CLINIC_OR_DEPARTMENT_OTHER): Payer: No Typology Code available for payment source | Admitting: Certified Registered"

## 2021-02-26 ENCOUNTER — Ambulatory Visit
Admission: RE | Admit: 2021-02-26 | Discharge: 2021-02-26 | Disposition: A | Discharge status: Home / Self Care | Payer: No Typology Code available for payment source | Source: Ambulatory Visit | Attending: General Surgery | Admitting: General Surgery

## 2021-02-26 ENCOUNTER — Ambulatory Visit (HOSPITAL_BASED_OUTPATIENT_CLINIC_OR_DEPARTMENT_OTHER)
Admission: RE | Admit: 2021-02-26 | Discharge: 2021-02-26 | Disposition: A | Discharge status: Home / Self Care | Payer: No Typology Code available for payment source | Attending: General Surgery | Admitting: General Surgery

## 2021-02-26 ENCOUNTER — Encounter (HOSPITAL_BASED_OUTPATIENT_CLINIC_OR_DEPARTMENT_OTHER): Payer: Self-pay | Admitting: General Surgery

## 2021-02-26 ENCOUNTER — Encounter (HOSPITAL_BASED_OUTPATIENT_CLINIC_OR_DEPARTMENT_OTHER)
Admission: RE | Disposition: A | Discharge status: Home / Self Care | Payer: Self-pay | Source: Home / Self Care | Attending: General Surgery

## 2021-02-26 DIAGNOSIS — Z803 Family history of malignant neoplasm of breast: Secondary | ICD-10-CM | POA: Diagnosis not present

## 2021-02-26 DIAGNOSIS — N6011 Diffuse cystic mastopathy of right breast: Secondary | ICD-10-CM | POA: Diagnosis not present

## 2021-02-26 DIAGNOSIS — N6091 Unspecified benign mammary dysplasia of right breast: Secondary | ICD-10-CM

## 2021-02-26 DIAGNOSIS — N6021 Fibroadenosis of right breast: Secondary | ICD-10-CM | POA: Insufficient documentation

## 2021-02-26 HISTORY — DX: Unspecified osteoarthritis, unspecified site: M19.90

## 2021-02-26 HISTORY — PX: BREAST LUMPECTOMY WITH RADIOACTIVE SEED LOCALIZATION: SHX6424

## 2021-02-26 HISTORY — PX: BREAST EXCISIONAL BIOPSY: SUR124

## 2021-02-26 LAB — POCT PREGNANCY, URINE: Preg Test, Ur: NEGATIVE

## 2021-02-26 SURGERY — BREAST LUMPECTOMY WITH RADIOACTIVE SEED LOCALIZATION
Anesthesia: General | Site: Breast | Laterality: Right

## 2021-02-26 MED ORDER — FENTANYL CITRATE (PF) 100 MCG/2ML IJ SOLN: 25.0000 ug | INTRAMUSCULAR | Status: DC | PRN

## 2021-02-26 MED ORDER — DEXAMETHASONE SODIUM PHOSPHATE 4 MG/ML IJ SOLN
INTRAMUSCULAR | Status: DC | PRN
Start: 2021-02-26 — End: 2021-02-26
  Administered 2021-02-26: 5 mg via INTRAVENOUS

## 2021-02-26 MED ORDER — BUPIVACAINE-EPINEPHRINE (PF) 0.25% -1:200000 IJ SOLN
INTRAMUSCULAR | Status: DC | PRN
  Administered 2021-02-26: 20 mL

## 2021-02-26 MED ORDER — PROPOFOL 500 MG/50ML IV EMUL
INTRAVENOUS | Status: DC | PRN
Start: 2021-02-26 — End: 2021-02-26
  Administered 2021-02-26: 25 ug/kg/min via INTRAVENOUS

## 2021-02-26 MED ORDER — CEFAZOLIN SODIUM-DEXTROSE 2-4 GM/100ML-% IV SOLN
2.0000 g | INTRAVENOUS | Status: AC
  Administered 2021-02-26: 2 g via INTRAVENOUS

## 2021-02-26 MED ORDER — CELECOXIB 200 MG PO CAPS
200.0000 mg | ORAL_CAPSULE | ORAL | Status: AC
  Administered 2021-02-26: 200 mg via ORAL

## 2021-02-26 MED ORDER — LACTATED RINGERS IV SOLN: INTRAVENOUS | Status: DC

## 2021-02-26 MED ORDER — ACETAMINOPHEN 10 MG/ML IV SOLN: 1000.0000 mg | Freq: Once | INTRAVENOUS | Status: DC | PRN

## 2021-02-26 MED ORDER — FENTANYL CITRATE (PF) 100 MCG/2ML IJ SOLN
INTRAMUSCULAR | Status: DC | PRN
  Administered 2021-02-26: 50 ug via INTRAVENOUS
  Administered 2021-02-26 (×2): 25 ug via INTRAVENOUS

## 2021-02-26 MED ORDER — BUPIVACAINE-EPINEPHRINE (PF) 0.25% -1:200000 IJ SOLN
INTRAMUSCULAR | Status: AC
  Filled 2021-02-26: qty 30

## 2021-02-26 MED ORDER — CHLORHEXIDINE GLUCONATE CLOTH 2 % EX PADS: 6.0000 | MEDICATED_PAD | Freq: Once | CUTANEOUS | Status: DC

## 2021-02-26 MED ORDER — ACETAMINOPHEN 160 MG/5ML PO SOLN: 325.0000 mg | ORAL | Status: DC | PRN

## 2021-02-26 MED ORDER — FENTANYL CITRATE (PF) 100 MCG/2ML IJ SOLN
INTRAMUSCULAR | Status: AC
  Filled 2021-02-26: qty 2

## 2021-02-26 MED ORDER — 0.9 % SODIUM CHLORIDE (POUR BTL) OPTIME
TOPICAL | Status: DC | PRN
Start: 2021-02-26 — End: 2021-02-26
  Administered 2021-02-26: 100 mL

## 2021-02-26 MED ORDER — SCOPOLAMINE 1 MG/3DAYS TD PT72
MEDICATED_PATCH | TRANSDERMAL | Status: AC
  Filled 2021-02-26: qty 1

## 2021-02-26 MED ORDER — GABAPENTIN 300 MG PO CAPS
ORAL_CAPSULE | ORAL | Status: AC
  Filled 2021-02-26: qty 1

## 2021-02-26 MED ORDER — LIDOCAINE 2% (20 MG/ML) 5 ML SYRINGE
INTRAMUSCULAR | Status: DC | PRN
  Administered 2021-02-26: 100 mg via INTRAVENOUS

## 2021-02-26 MED ORDER — CELECOXIB 200 MG PO CAPS
ORAL_CAPSULE | ORAL | Status: AC
  Filled 2021-02-26: qty 1

## 2021-02-26 MED ORDER — ONDANSETRON HCL 4 MG/2ML IJ SOLN
INTRAMUSCULAR | Status: DC | PRN
Start: 2021-02-26 — End: 2021-02-26
  Administered 2021-02-26: 4 mg via INTRAVENOUS

## 2021-02-26 MED ORDER — AMISULPRIDE (ANTIEMETIC) 5 MG/2ML IV SOLN: 10.0000 mg | Freq: Once | INTRAVENOUS | Status: DC | PRN

## 2021-02-26 MED ORDER — ACETAMINOPHEN 500 MG PO TABS
1000.0000 mg | ORAL_TABLET | ORAL | Status: AC
Start: 2021-02-26 — End: 2021-02-26
  Administered 2021-02-26: 1000 mg via ORAL

## 2021-02-26 MED ORDER — OXYCODONE HCL 5 MG PO TABS: 5.0000 mg | ORAL_TABLET | Freq: Once | ORAL | Status: DC | PRN

## 2021-02-26 MED ORDER — PROPOFOL 10 MG/ML IV BOLUS
INTRAVENOUS | Status: DC | PRN
Start: 2021-02-26 — End: 2021-02-26
  Administered 2021-02-26: 200 mg via INTRAVENOUS

## 2021-02-26 MED ORDER — HYDROCODONE-ACETAMINOPHEN 5-325 MG PO TABS: 1.0000 | ORAL_TABLET | Freq: Four times a day (QID) | ORAL | 0 refills | Status: AC | PRN

## 2021-02-26 MED ORDER — CEFAZOLIN SODIUM-DEXTROSE 2-4 GM/100ML-% IV SOLN
INTRAVENOUS | Status: AC
  Filled 2021-02-26: qty 100

## 2021-02-26 MED ORDER — SCOPOLAMINE 1 MG/3DAYS TD PT72
1.0000 | MEDICATED_PATCH | TRANSDERMAL | Status: DC
  Administered 2021-02-26: 1.5 mg via TRANSDERMAL

## 2021-02-26 MED ORDER — GABAPENTIN 300 MG PO CAPS
300.0000 mg | ORAL_CAPSULE | ORAL | Status: AC
  Administered 2021-02-26: 300 mg via ORAL

## 2021-02-26 MED ORDER — OXYCODONE HCL 5 MG/5ML PO SOLN: 5.0000 mg | Freq: Once | ORAL | Status: DC | PRN

## 2021-02-26 MED ORDER — ACETAMINOPHEN 500 MG PO TABS
ORAL_TABLET | ORAL | Status: AC
  Filled 2021-02-26: qty 2

## 2021-02-26 MED ORDER — MIDAZOLAM HCL 2 MG/2ML IJ SOLN
INTRAMUSCULAR | Status: AC
  Filled 2021-02-26: qty 2

## 2021-02-26 MED ORDER — ACETAMINOPHEN 325 MG PO TABS: 325.0000 mg | ORAL_TABLET | ORAL | Status: DC | PRN

## 2021-02-26 MED ORDER — PROMETHAZINE HCL 25 MG/ML IJ SOLN: 6.2500 mg | INTRAMUSCULAR | Status: DC | PRN

## 2021-02-26 SURGICAL SUPPLY — 44 items
ADH SKN CLS APL DERMABOND .7 (GAUZE/BANDAGES/DRESSINGS) ×1
APL PRP STRL LF DISP 70% ISPRP (MISCELLANEOUS) ×1
APPLIER CLIP 9.375 MED OPEN (MISCELLANEOUS)
APR CLP MED 9.3 20 MLT OPN (MISCELLANEOUS)
BLADE SURG 15 STRL LF DISP TIS (BLADE) ×1 IMPLANT
BLADE SURG 15 STRL SS (BLADE) ×2
CANISTER SUC SOCK COL 7IN (MISCELLANEOUS) ×2 IMPLANT
CANISTER SUCT 1200ML W/VALVE (MISCELLANEOUS) ×2 IMPLANT
CHLORAPREP W/TINT 26 (MISCELLANEOUS) ×2 IMPLANT
CLIP APPLIE 9.375 MED OPEN (MISCELLANEOUS) IMPLANT
COVER BACK TABLE 60X90IN (DRAPES) ×2 IMPLANT
COVER MAYO STAND STRL (DRAPES) ×2 IMPLANT
COVER PROBE W GEL 5X96 (DRAPES) ×2 IMPLANT
DERMABOND ADVANCED (GAUZE/BANDAGES/DRESSINGS) ×1
DERMABOND ADVANCED .7 DNX12 (GAUZE/BANDAGES/DRESSINGS) ×1 IMPLANT
DRAPE LAPAROSCOPIC ABDOMINAL (DRAPES) ×2 IMPLANT
DRAPE UTILITY XL STRL (DRAPES) ×2 IMPLANT
ELECT COATED BLADE 2.86 ST (ELECTRODE) ×2 IMPLANT
ELECT REM PT RETURN 9FT ADLT (ELECTROSURGICAL) ×2
ELECTRODE REM PT RTRN 9FT ADLT (ELECTROSURGICAL) ×1 IMPLANT
GLOVE SURG ENC MOIS LTX SZ7.5 (GLOVE) ×4 IMPLANT
GLOVE SURG POLYISO LF SZ7 (GLOVE) ×1 IMPLANT
GLOVE SURG UNDER POLY LF SZ7 (GLOVE) ×2 IMPLANT
GOWN STRL REUS W/ TWL LRG LVL3 (GOWN DISPOSABLE) ×2 IMPLANT
GOWN STRL REUS W/TWL LRG LVL3 (GOWN DISPOSABLE) ×4
ILLUMINATOR WAVEGUIDE N/F (MISCELLANEOUS) IMPLANT
KIT MARKER MARGIN INK (KITS) ×2 IMPLANT
LIGHT WAVEGUIDE WIDE FLAT (MISCELLANEOUS) IMPLANT
NDL HYPO 25X1 1.5 SAFETY (NEEDLE) IMPLANT
NEEDLE HYPO 25X1 1.5 SAFETY (NEEDLE) ×2 IMPLANT
NS IRRIG 1000ML POUR BTL (IV SOLUTION) IMPLANT
PACK BASIN DAY SURGERY FS (CUSTOM PROCEDURE TRAY) ×2 IMPLANT
PENCIL SMOKE EVACUATOR (MISCELLANEOUS) ×2 IMPLANT
SLEEVE SCD COMPRESS KNEE MED (STOCKING) ×2 IMPLANT
SPIKE FLUID TRANSFER (MISCELLANEOUS) IMPLANT
SPONGE T-LAP 18X18 ~~LOC~~+RFID (SPONGE) ×2 IMPLANT
SUT MON AB 4-0 PC3 18 (SUTURE) ×2 IMPLANT
SUT SILK 2 0 SH (SUTURE) IMPLANT
SUT VICRYL 3-0 CR8 SH (SUTURE) ×2 IMPLANT
SYR CONTROL 10ML LL (SYRINGE) ×1 IMPLANT
TOWEL GREEN STERILE FF (TOWEL DISPOSABLE) ×2 IMPLANT
TRAY FAXITRON CT DISP (TRAY / TRAY PROCEDURE) ×2 IMPLANT
TUBE CONNECTING 20X1/4 (TUBING) ×2 IMPLANT
YANKAUER SUCT BULB TIP NO VENT (SUCTIONS) ×1 IMPLANT

## 2021-02-26 NOTE — H&P (Signed)
REFERRING PHYSICIAN: Marina Goodell, *  PROVIDER: Lindell Noe, MD  MRN: [id number] DOB: 02-25-1970 Subjective  Chief Complaint: Follow-up (/)   History of Present Illness: ZONIA CAPLIN is a 51 y.o. female who is seen today as an office consultation at the request of Dr. Tobey Bride for evaluation of Follow-up (/) .   We are asked to see the patient in consultation by Dr. Assunta Found to evaluate her for atypical ductal hyperplasia of the right breast. The patient is a 52 year old white female who recently went for a routine screening mammogram. At that time she was found to have a 3 mm area of calcification in the lower outer right breast. This was biopsied and came back as atypical ductal hyperplasia. She is otherwise in good health and does not smoke. She does have a family history of breast cancer in a paternal grandmother  Review of Systems: A complete review of systems was obtained from the patient. I have reviewed this information and discussed as appropriate with the patient. See HPI as well for other ROS.  ROS   Medical History: History reviewed. No pertinent past medical history.  Patient Active Problem List  Diagnosis   Atypical ductal hyperplasia of right breast   Past Surgical History:  Procedure Laterality Date   CERVICAL BIOPSY W/ LOOP ELECTRODE EXCISION    Allergies  Allergen Reactions   Amoxicillin-Pot Clavulanate Other (See Comments)   No current outpatient medications on file prior to visit.   No current facility-administered medications on file prior to visit.   Family History  Problem Relation Age of Onset   Stroke Father   Breast cancer Paternal Grandmother    Social History   Tobacco Use  Smoking Status Never  Smokeless Tobacco Never    Social History   Socioeconomic History   Marital status: Divorced  Tobacco Use   Smoking status: Never   Smokeless tobacco: Never  Substance and Sexual Activity   Alcohol use: Yes   Comment: socially   Drug use: Never   Objective:   Vitals:  BP: 136/80  Pulse: 101  Temp: 36.7 C (98.1 F)  SpO2: 100%  Weight: 95.7 kg (211 lb)  Height: 179.1 cm (5' 10.5")   Body mass index is 29.85 kg/m.  Physical Exam Vitals reviewed.  Constitutional:  General: She is not in acute distress. Appearance: Normal appearance.  HENT:  Head: Normocephalic and atraumatic.  Right Ear: External ear normal.  Left Ear: External ear normal.  Nose: Nose normal.  Mouth/Throat:  Mouth: Mucous membranes are moist.  Pharynx: Oropharynx is clear.  Eyes:  General: No scleral icterus. Extraocular Movements: Extraocular movements intact.  Conjunctiva/sclera: Conjunctivae normal.  Pupils: Pupils are equal, round, and reactive to light.  Cardiovascular:  Rate and Rhythm: Normal rate and regular rhythm.  Pulses: Normal pulses.  Heart sounds: Normal heart sounds.  Pulmonary:  Effort: Pulmonary effort is normal. No respiratory distress.  Breath sounds: Normal breath sounds.  Abdominal:  General: Bowel sounds are normal.  Palpations: Abdomen is soft.  Tenderness: There is no abdominal tenderness.  Musculoskeletal:  General: No swelling, tenderness or deformity. Normal range of motion.  Cervical back: Normal range of motion and neck supple.  Skin: General: Skin is warm and dry.  Coloration: Skin is not jaundiced.  Neurological:  General: No focal deficit present.  Mental Status: She is alert and oriented to person, place, and time.  Psychiatric:  Mood and Affect: Mood normal.  Behavior: Behavior normal.  Breast: There is no palpable mass in either breast. There is no palpable axillary, supraclavicular, or cervical lymphadenopathy.  Labs, Imaging and Diagnostic Testing:  Assessment and Plan:  Diagnoses and all orders for this visit:  Atypical ductal hyperplasia of right breast - Ambulatory Referral to Oncology-Medical - [facility] Case Posting Request; Future    The  patient appears to have a 3 mm area of atypical ductal hyperplasia in the lower outer quadrant of the right breast. Because ADH can look very similar to DCIS and because it is considered a high risk lesion I would recommend that this area be removed. I have discussed with her in detail the risks and benefits of the operation as well as some of the technical aspects including the use of a radioactive seed for localization and she understands and wishes to proceed. The presence of ADH can also increase her lifetime risk of breast cancer significantly to around 30%. Because of this I will also refer her to the high risk clinic at the cancer center to talk about risk reduction.

## 2021-02-26 NOTE — Anesthesia Postprocedure Evaluation (Signed)
Anesthesia Post Note  Patient: Sheryl Dominguez  Procedure(s) Performed: RIGHT BREAST LUMPECTOMY WITH RADIOACTIVE SEED LOCALIZATION (Right: Breast)     Patient location during evaluation: PACU Anesthesia Type: General Level of consciousness: awake and alert Pain management: pain level controlled Vital Signs Assessment: post-procedure vital signs reviewed and stable Respiratory status: spontaneous breathing, nonlabored ventilation, respiratory function stable and patient connected to nasal cannula oxygen Cardiovascular status: blood pressure returned to baseline and stable Postop Assessment: no apparent nausea or vomiting Anesthetic complications: no   No notable events documented.  Last Vitals:  Vitals:   02/26/21 1145 02/26/21 1200  BP: 108/71 115/76  Pulse: 76 70  Resp: 19 18  Temp:    SpO2: 97% 98%    Last Pain:  Vitals:   02/26/21 1145  TempSrc:   PainSc: 0-No pain                 Shelton Silvas

## 2021-02-26 NOTE — Anesthesia Preprocedure Evaluation (Addendum)
Anesthesia Evaluation  Patient identified by MRN, date of birth, ID band Patient awake    Reviewed: Allergy & Precautions, NPO status , Patient's Chart, lab work & pertinent test results  Airway Mallampati: II  TM Distance: >3 FB Neck ROM: Full    Dental  (+) Teeth Intact, Dental Advisory Given   Pulmonary neg pulmonary ROS,    breath sounds clear to auscultation       Cardiovascular negative cardio ROS   Rhythm:Regular Rate:Normal     Neuro/Psych negative neurological ROS  negative psych ROS   GI/Hepatic negative GI ROS, Neg liver ROS,   Endo/Other  negative endocrine ROS  Renal/GU negative Renal ROS     Musculoskeletal  (+) Arthritis ,   Abdominal Normal abdominal exam  (+)   Peds  Hematology   Anesthesia Other Findings   Reproductive/Obstetrics                            Anesthesia Physical Anesthesia Plan  ASA: 2  Anesthesia Plan: General   Post-op Pain Management:    Induction: Intravenous  PONV Risk Score and Plan: 4 or greater and Ondansetron, Dexamethasone, Midazolam and Scopolamine patch - Pre-op  Airway Management Planned: LMA  Additional Equipment: None  Intra-op Plan:   Post-operative Plan: Extubation in OR  Informed Consent: I have reviewed the patients History and Physical, chart, labs and discussed the procedure including the risks, benefits and alternatives for the proposed anesthesia with the patient or authorized representative who has indicated his/her understanding and acceptance.     Dental advisory given  Plan Discussed with: CRNA  Anesthesia Plan Comments:        Anesthesia Quick Evaluation

## 2021-02-26 NOTE — Transfer of Care (Signed)
Immediate Anesthesia Transfer of Care Note  Patient: Sheryl Dominguez  Procedure(s) Performed: RIGHT BREAST LUMPECTOMY WITH RADIOACTIVE SEED LOCALIZATION (Right: Breast)  Patient Location: PACU  Anesthesia Type:General  Level of Consciousness: drowsy and patient cooperative  Airway & Oxygen Therapy: Patient Spontanous Breathing and Patient connected to face mask oxygen  Post-op Assessment: Report given to RN and Post -op Vital signs reviewed and stable  Post vital signs: Reviewed and stable  Last Vitals:  Vitals Value Taken Time  BP    Temp    Pulse 72 02/26/21 1115  Resp    SpO2 99 % 02/26/21 1115  Vitals shown include unvalidated device data.  Last Pain:  Vitals:   02/26/21 0822  TempSrc: Oral  PainSc: 0-No pain      Patients Stated Pain Goal: 2 (02/26/21 5361)  Complications: No notable events documented.

## 2021-02-26 NOTE — Interval H&P Note (Signed)
History and Physical Interval Note:  02/26/2021 10:05 AM  Sheryl Dominguez  has presented today for surgery, with the diagnosis of RIGHT BREAST ADH.  The various methods of treatment have been discussed with the patient and family. After consideration of risks, benefits and other options for treatment, the patient has consented to  Procedure(s): RIGHT BREAST LUMPECTOMY WITH RADIOACTIVE SEED LOCALIZATION (Right) as a surgical intervention.  The patient's history has been reviewed, patient examined, no change in status, stable for surgery.  I have reviewed the patient's chart and labs.  Questions were answered to the patient's satisfaction.     [name]

## 2021-02-26 NOTE — Anesthesia Procedure Notes (Signed)
Procedure Name: LMA Insertion Date/Time: 02/26/2021 10:30 AM Performed by: Sheryn Bison, CRNA Pre-anesthesia Checklist: Patient identified, Emergency Drugs available, Suction available and Patient being monitored Patient Re-evaluated:Patient Re-evaluated prior to induction Oxygen Delivery Method: Circle System Utilized Preoxygenation: Pre-oxygenation with 100% oxygen Induction Type: IV induction Ventilation: Mask ventilation without difficulty LMA: LMA inserted LMA Size: 4.0 Number of attempts: 1 Airway Equipment and Method: bite block Placement Confirmation: positive ETCO2 Tube secured with: Tape Dental Injury: Teeth and Oropharynx as per pre-operative assessment

## 2021-02-26 NOTE — Op Note (Signed)
02/26/2021  11:09 AM  PATIENT:  Sheryl Dominguez  51 y.o. female  PRE-OPERATIVE DIAGNOSIS:  RIGHT BREAST ADH  POST-OPERATIVE DIAGNOSIS:  RIGHT BREAST ADH  PROCEDURE:  Procedure(s): RIGHT BREAST LUMPECTOMY WITH RADIOACTIVE SEED LOCALIZATION (Right)  SURGEON:  Surgeon(s) and Role:    * Griselda Miner, MD - Primary  PHYSICIAN ASSISTANT:   ASSISTANTS: none   ANESTHESIA:   local and general  EBL:  minimal   BLOOD ADMINISTERED:none  DRAINS: none   LOCAL MEDICATIONS USED:  MARCAINE     SPECIMEN:  Source of Specimen:  right breast tissue  DISPOSITION OF SPECIMEN:  PATHOLOGY  COUNTS:  YES  TOURNIQUET:  * No tourniquets in log *  DICTATION: .Dragon Dictation  After informed consent was obtained the patient was brought to the operating room and placed in the supine position on the operating room table.  After adequate induction of general anesthesia the patient's right breast was prepped with ChloraPrep, allowed to dry, draped in usual sterile manner.  An appropriate timeout was performed.  Previously an I-125 seed was placed in the outer aspect of the right breast to mark an area of atypical ductal hyperplasia.  The neoprobe was set to I-125 in the area of radioactivity was readily identified.  The area around this was infiltrated with quarter percent Marcaine.  A curvilinear incision was made along the outer aspect of the right breast with a 15 blade knife.  The incision was carried through the skin and subcutaneous tissue sharply with the electrocautery.  Dissection was then carried out towards the radioactive seed under the direction of the neoprobe.  Once I more closely approached the radioactive seed I then removed a circular portion of breast tissue sharply with the electrocautery around the radioactive seed while checking the area of radioactivity frequently.  Once the specimen was removed it was oriented with the appropriate paint colors.  A specimen radiograph was obtained that  showed the clip and seed to be near the center of the specimen.  The specimen was then sent to pathology for further evaluation.  Hemostasis was achieved using the Bovie electrocautery.  The wound was irrigated with saline and infiltrated with more quarter percent Marcaine.  The deep layer of the incision was closed with interrupted 3-0 Vicryl stitches.  The skin was then closed with interrupted 4-0 Monocryl subcuticular stitches.  Dermabond dressings were applied.  The patient tolerated the procedure well.  At the end of the case all needle sponge and instrument counts were correct.  The patient was then awakened and taken to recovery in stable condition.  PLAN OF CARE: Discharge to home after PACU  PATIENT DISPOSITION:  PACU - hemodynamically stable.   Delay start of Pharmacological VTE agent (>24hrs) due to surgical blood loss or risk of bleeding: not applicable

## 2021-02-26 NOTE — Discharge Instructions (Signed)
Next dose of Tylenol after 2:25pm as needed for pain Next dose of NSAIDs/Motrin after 4:25pm as needed for pain   Post Anesthesia Home Care Instructions  Activity: Get plenty of rest for the remainder of the day. A responsible individual must stay with you for 24 hours following the procedure.  For the next 24 hours, DO NOT: -Drive a car -Advertising copywriter -Drink alcoholic beverages -Take any medication unless instructed by your physician -Make any legal decisions or sign important papers.  Meals: Start with liquid foods such as gelatin or soup. Progress to regular foods as tolerated. Avoid greasy, spicy, heavy foods. If nausea and/or vomiting occur, drink only clear liquids until the nausea and/or vomiting subsides. Call your physician if vomiting continues.  Special Instructions/Symptoms: Your throat may feel dry or sore from the anesthesia or the breathing tube placed in your throat during surgery. If this causes discomfort, gargle with warm salt water. The discomfort should disappear within 24 hours.  If you had a scopolamine patch placed behind your ear for the management of post- operative nausea and/or vomiting:  1. The medication in the patch is effective for 72 hours, after which it should be removed.  Wrap patch in a tissue and discard in the trash. Wash hands thoroughly with soap and water. 2. You may remove the patch earlier than 72 hours if you experience unpleasant side effects which may include dry mouth, dizziness or visual disturbances. 3. Avoid touching the patch. Wash your hands with soap and water after contact with the patch.

## 2021-03-01 ENCOUNTER — Encounter (HOSPITAL_BASED_OUTPATIENT_CLINIC_OR_DEPARTMENT_OTHER): Payer: Self-pay | Admitting: General Surgery

## 2021-03-01 LAB — SURGICAL PATHOLOGY

## 2021-03-25 ENCOUNTER — Inpatient Hospital Stay: Payer: No Typology Code available for payment source | Attending: Hematology | Admitting: Hematology and Oncology

## 2021-03-25 ENCOUNTER — Other Ambulatory Visit: Payer: Self-pay

## 2021-03-25 VITALS — BP 113/80 | HR 73 | Temp 97.7°F | Resp 16 | Ht 71.0 in | Wt 211.5 lb

## 2021-03-25 DIAGNOSIS — N6091 Unspecified benign mammary dysplasia of right breast: Secondary | ICD-10-CM | POA: Diagnosis not present

## 2021-03-25 MED ORDER — TAMOXIFEN CITRATE 10 MG PO TABS: 5.0000 mg | ORAL_TABLET | Freq: Two times a day (BID) | ORAL | 3 refills | Status: DC

## 2021-03-25 NOTE — Progress Notes (Signed)
Sheryl Dominguez  Patient Care Team: Sheryl Sites, MD as PCP - General (Family Medicine)  CHIEF COMPLAINTS/PURPOSE OF CONSULTATION:  Atypical ductal hyperplasia of right breast  ASSESSMENT & PLAN:   This is a very pleasant 51 year old Caucasian female patient with no significant past medical history except for abnormal Pap smear status post cervical LEEP procedure referred to medical oncology, high risk breast cancer clinic given her recent diagnosis of atypical ductal hyperplasia of the right breast.    During her last visit, we discussed about tamoxifen,healthy lifestyle, changing diet to more plant-based and annual mammograms and to consider MRIs. She had lumpectomy which didn't show any further evidence of ADH. We have once again discussed today about role of tamoxifen in the prevention of breast cancer. She understands the adverse effects of tamoxifen including but not limited to DVT/PE, increased risk of uterine cancer, increased risk of cardiovascular events, benefit on bone density. We have discussed about data on 5 mg of tamoxifen. Patient would like to try it, prescription has been sent. She was instructed to hold the medication 2 weeks before any anticipated surgery and to call us any with concerns.  HISTORY OF PRESENTING ILLNESS:  Sheryl Dominguez 51 y.o. female is here because of atypical ductal hyperplasia of right breast Sheryl Dominguez is a 51 year old female patient who went for routine screening mammogram.  She was found to have a 3 mm area of calcification in the lower outer right breast.  This was biopsied and came back as atypical ductal hyperplasia.  She does have family history of breast cancer in a paternal grandmother.   She had cervical biopsy with loop electrode excision in her past surgical history She had right breast lumpectomy which showed benign breast with proliferative fibrocystic changes including stromal fibrosis, adenosis and epithelial  hyperplasia without atypia, pseudoangiomatous stromal hyperplasia changes consistent with prior biopsy.  Negative for microcalcifications and negative for carcinoma. She is very satisfied with the surgical outcome. No concerning ROS.  MEDICAL HISTORY:  Past Medical History:  Diagnosis Date   Arthritis    back and bil hip   Atypical ductal hyperplasia of right breast 01/2021   Bronchitis 11/2020   Pelvic fracture (Sheryl Dominguez) 01/25/1988    SURGICAL HISTORY: Past Surgical History:  Procedure Laterality Date   BREAST LUMPECTOMY WITH RADIOACTIVE SEED LOCALIZATION Right 02/26/2021   Procedure: RIGHT BREAST LUMPECTOMY WITH RADIOACTIVE SEED LOCALIZATION;  Surgeon: Sheryl Kussmaul, MD;  Location: [facility];  Service: General;  Laterality: Right;   CHOLECYSTECTOMY N/A 04/23/2014   Procedure: LAPAROSCOPIC CHOLECYSTECTOMY;  Surgeon: Sheryl Signs Md, MD;  Location: [facility];  Service: General;  Laterality: N/A;   WISDOM TOOTH EXTRACTION      SOCIAL HISTORY: Social History   Socioeconomic History   Marital status: Divorced    Spouse name: Not on file   Number of children: Not on file   Years of education: Not on file   Highest education level: Not on file  Occupational History   Not on file  Tobacco Use   Smoking status: Never   Smokeless tobacco: Not on file  Substance and Sexual Activity   Alcohol use: Yes    Comment: occ   Drug use: No   Sexual activity: Not Currently    Birth control/protection: None  Other Topics Concern   Not on file  Social History Narrative   Not on file   Social Determinants of Health   Financial Resource Strain: Not on file  Food Insecurity: Not on file  Transportation Needs: Not on file  Physical Activity: Not on file  Stress: Not on file  Social Connections: Not on file  Intimate Partner Violence: Not on file    FAMILY HISTORY: Family History  Problem Relation Age of Onset   Breast cancer Paternal Grandmother     ALLERGIES:  is  allergic to augmentin [amoxicillin-pot clavulanate].  MEDICATIONS:  Current Outpatient Medications  Medication Sig Dispense Refill   HYDROcodone-acetaminophen (NORCO/VICODIN) 5-325 MG tablet Take 1 tablet by mouth every 6 (six) hours as needed for moderate pain or severe pain. 10 tablet 0   ibuprofen (ADVIL,MOTRIN) 200 MG tablet Take 800 mg by mouth every 6 (six) hours as needed for moderate pain.     Multiple Vitamin (MULTIVITAMIN WITH MINERALS) TABS tablet Take 1 tablet by mouth daily.     No current facility-administered medications for this visit.     PHYSICAL EXAMINATION: ECOG PERFORMANCE STATUS: 0 - Asymptomatic  There were no vitals filed for this visit.  GENERAL:alert, no distress and comfortable SKIN: skin color, texture, turgor are normal, no rashes or significant lesions EYES: normal, conjunctiva are pink and non-injected, sclera clear OROPHARYNX:no exudate, no erythema and lips, buccal mucosa, and tongue normal  NECK: supple, thyroid normal size, non-tender, without nodularity LYMPH:  no palpable lymphadenopathy in the cervical, axillary  LUNGS: clear to auscultation and percussion with normal breathing effort HEART: regular rate & rhythm and no murmurs and no lower extremity edema ABDOMEN:abdomen soft, non-tender and normal bowel sounds Musculoskeletal:no cyanosis of digits and no clubbing  PSYCH: alert & oriented x 3 with fluent speech NEURO: no focal motor/sensory deficits Breast exam deferred today, recently had a breast exam by Dr. Marlou Dominguez.  LABORATORY DATA:  I have reviewed the data as listed Lab Results  Component Value Date   WBC 8.3 04/17/2014   HGB 13.2 04/17/2014   HCT 39.5 04/17/2014   MCV 88.0 04/17/2014   PLT 331 04/17/2014     Chemistry      Component Value Date/Time   NA 137 04/17/2014 1405   K 3.8 04/17/2014 1405   CL 105 04/17/2014 1405   CO2 24 04/17/2014 1405   BUN 12 04/17/2014 1405   CREATININE 0.82 04/17/2014 1405      Component  Value Date/Time   CALCIUM 9.0 04/17/2014 1405   ALKPHOS 75 04/17/2014 1405   AST 17 04/17/2014 1405   ALT 14 04/17/2014 1405   BILITOT 0.5 04/17/2014 1405     Right breast needle core biopsy, lateral, 8 to 9 o'clock position Atypical ductal hyperplasia with calcifications Usual ductal hyperplasia and fibrocystic changes.  RADIOGRAPHIC STUDIES: I have personally reviewed the radiological images as listed and agreed with the findings in the report. MM Breast Surgical Specimen  Result Date: 02/26/2021 CLINICAL DATA:  Evaluate surgical specimen following excision of RIGHT breast ADH. EXAM: SPECIMEN RADIOGRAPH OF THE RIGHT BREAST COMPARISON:  Previous exam(s). FINDINGS: Status post excision of the RIGHT breast. The radioactive seed and X biopsy marker clip are present and completely intact. IMPRESSION: Specimen radiograph of the RIGHT breast. Electronically Signed   By: Margarette Canada M.D.   On: 02/26/2021 10:56  MM RT RADIOACTIVE SEED LOC MAMMO GUIDE  Result Date: 02/25/2021 CLINICAL DATA:  Localization prior to surgery EXAM: MAMMOGRAPHIC GUIDED RADIOACTIVE SEED LOCALIZATION OF THE RIGHT BREAST COMPARISON:  Previous exam(s). FINDINGS: Patient presents for radioactive seed localization prior to surgery. I met with the patient and we discussed the procedure of seed  localization including benefits and alternatives. We discussed the high likelihood of a successful procedure. We discussed the risks of the procedure including infection, bleeding, tissue injury and further surgery. We discussed the low dose of radioactivity involved in the procedure. Informed, written consent was given. The usual time-out protocol was performed immediately prior to the procedure. Using mammographic guidance, sterile technique, 1% lidocaine and an I-125 radioactive seed, the biopsy clip was localized using a lateral approach. The follow-up mammogram images confirm the seed in the expected location and were marked for the surgeon.  Follow-up survey of the patient confirms presence of the radioactive seed. Order number of I-125 seed:  [id number]. Total activity:  8.166 millicuries reference Date: February 22, 2021 The patient tolerated the procedure well and was released from the [facility]. She was given instructions regarding seed removal. IMPRESSION: Radioactive seed localization right breast. No apparent complications. Electronically Signed   By: Dorise Bullion III M.D.   On: 02/25/2021 14:25   All questions were answered. The patient knows to call the clinic with any problems, questions or concerns.     Benay Pike, MD 03/25/2021 1:14 PM

## 2021-03-26 ENCOUNTER — Telehealth: Payer: Self-pay | Admitting: Hematology and Oncology

## 2021-03-26 NOTE — Telephone Encounter (Signed)
Scheduled appointment per 03/02 los. Left message with details.  ?

## 2021-04-08 ENCOUNTER — Encounter (HOSPITAL_COMMUNITY): Payer: Self-pay

## 2021-06-25 ENCOUNTER — Inpatient Hospital Stay: Payer: No Typology Code available for payment source | Attending: Hematology | Admitting: Hematology and Oncology

## 2022-11-20 IMAGING — MG MM PLC BREAST LOC DEV 1ST LESION INC*R*
8 series · 8 of 8 positions shown · non-contrast
Comparison: Previous exam(s).

CLINICAL DATA: Localization prior to surgery

EXAM:
MAMMOGRAPHIC GUIDED RADIOACTIVE SEED LOCALIZATION OF THE RIGHT
BREAST

[R LM (1 of 4)]
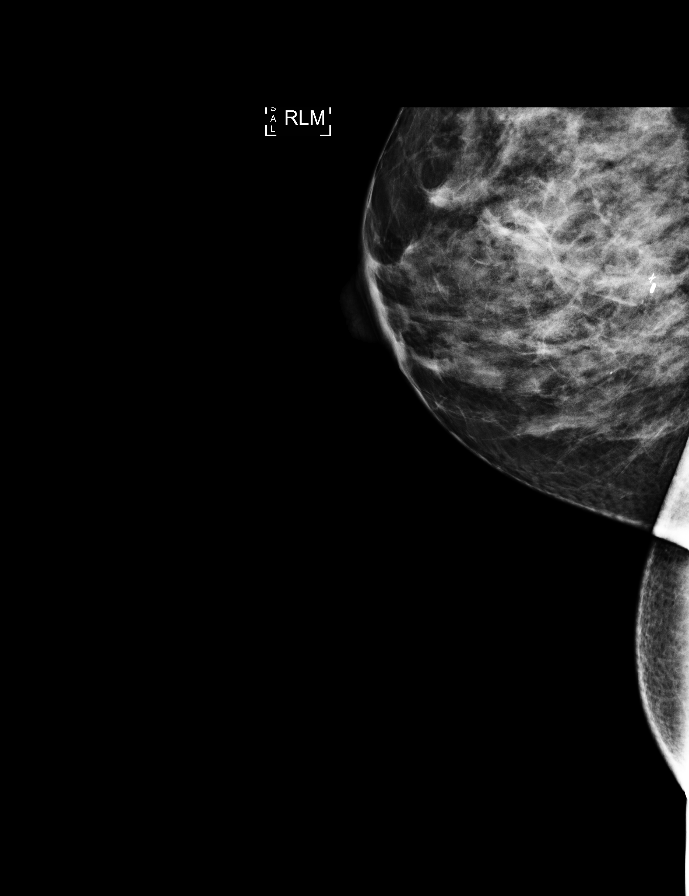

[R LM (2 of 4)]
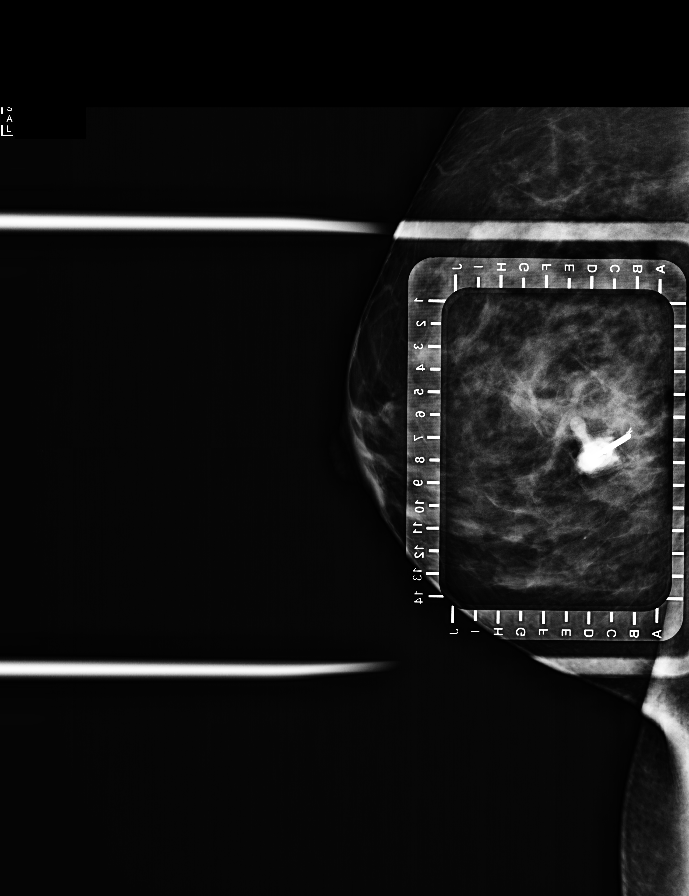

[R LM (3 of 4)]
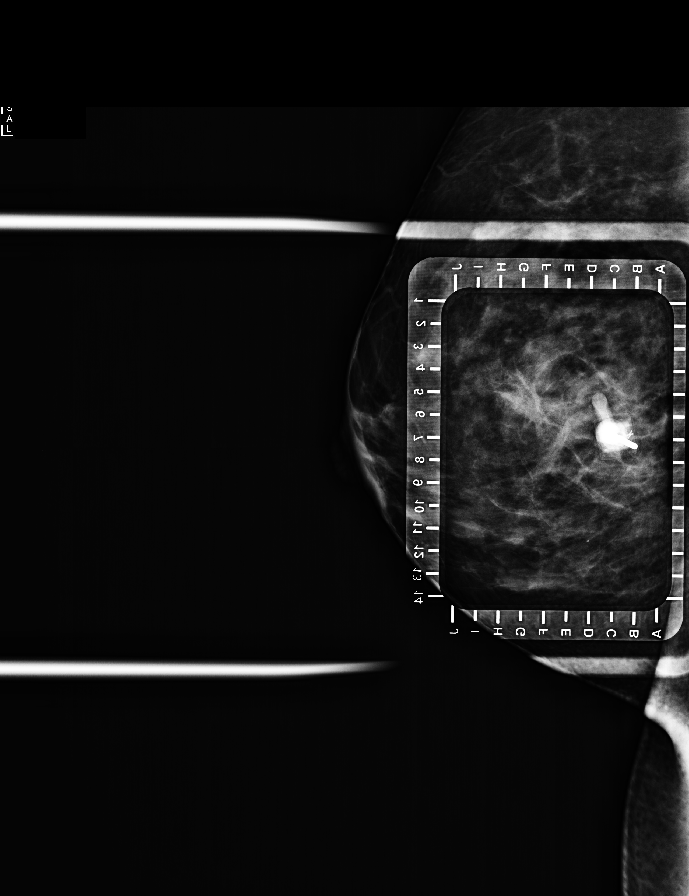

[R CC (1 of 4)]
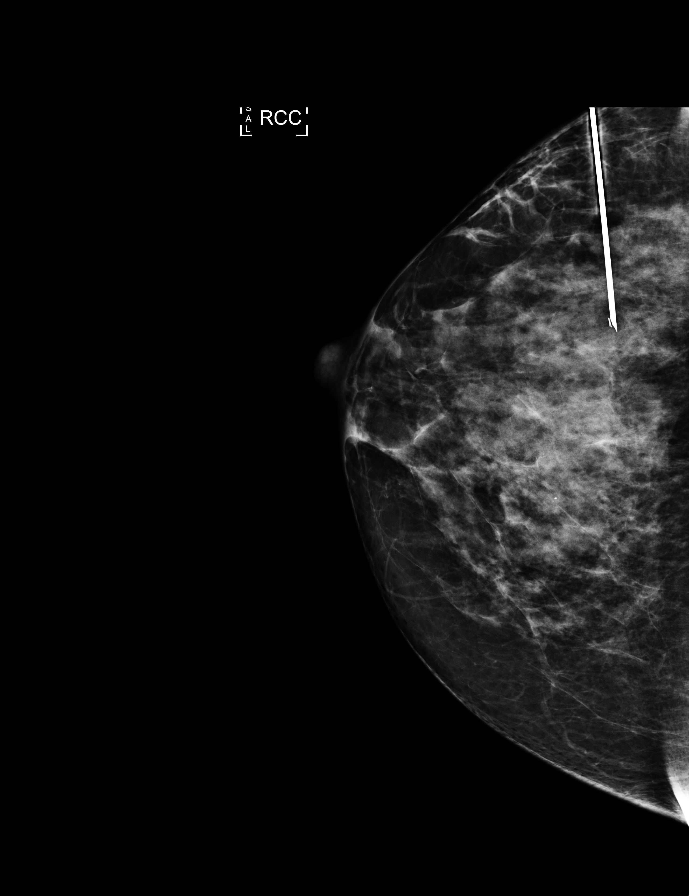

[R CC (2 of 4)]
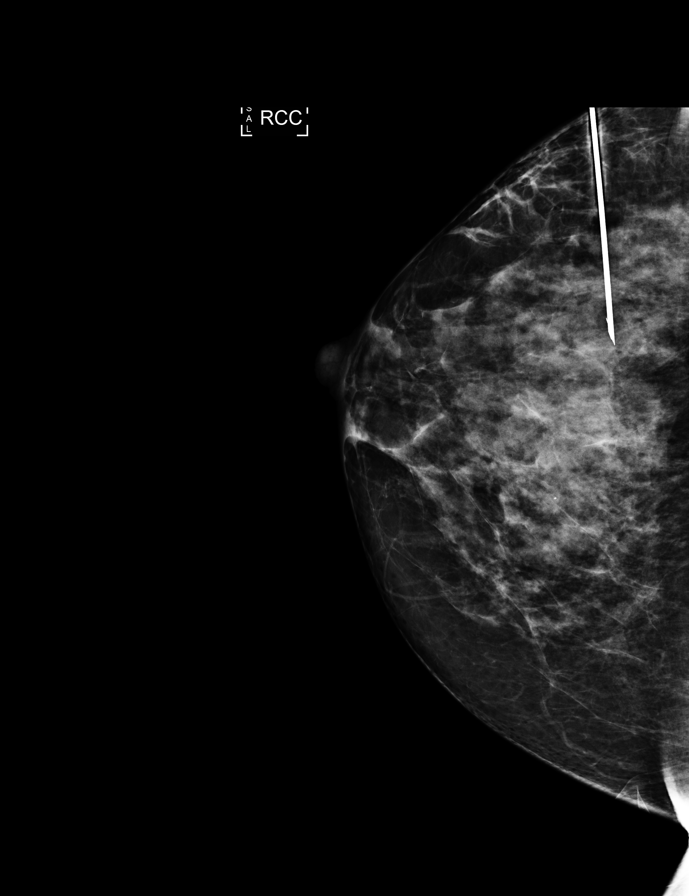

[R CC (3 of 4)]
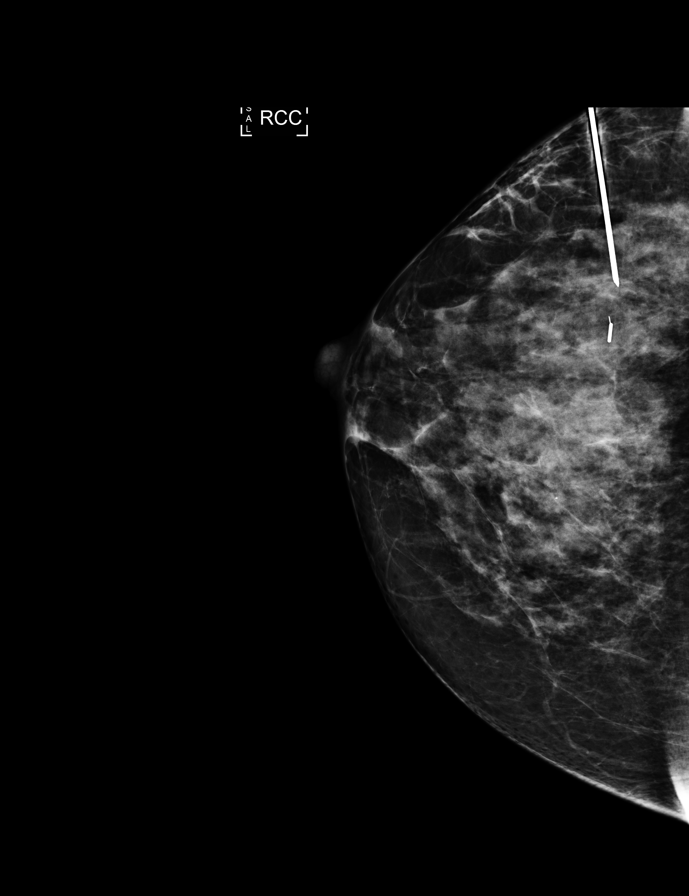

[R LM (4 of 4)]
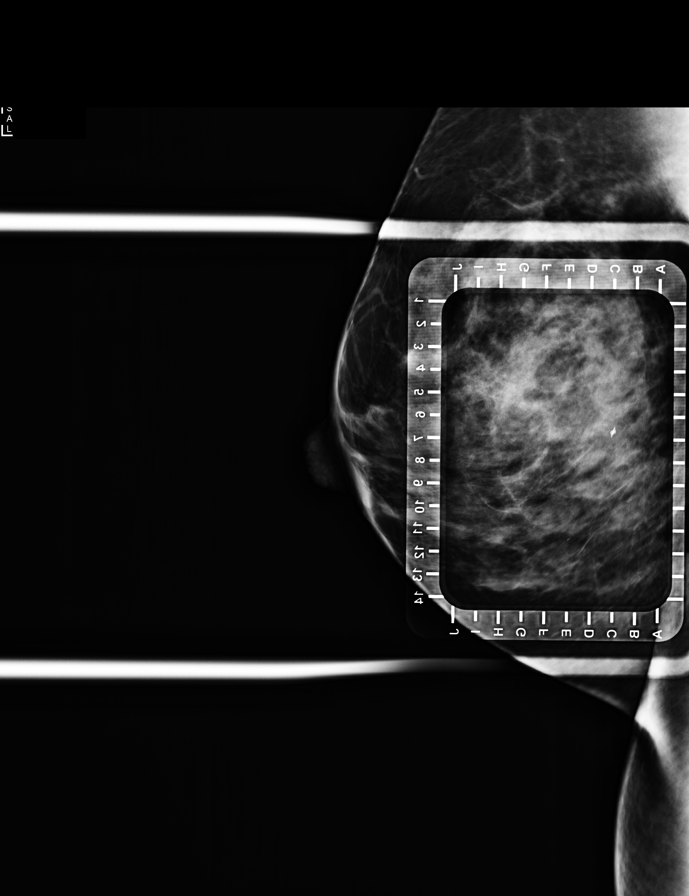

[R CC (4 of 4)]
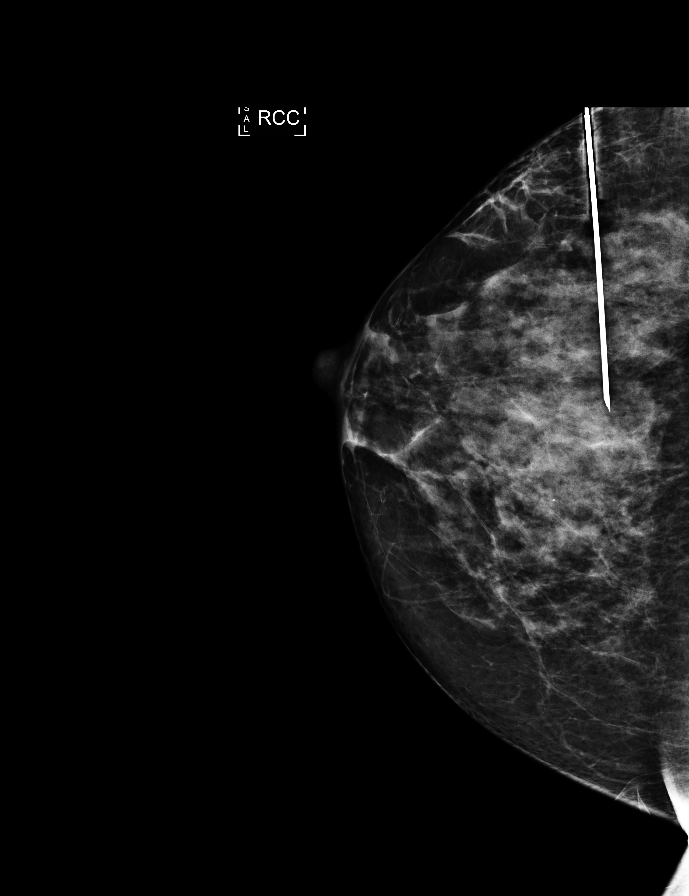

[8 of 8 positions shown; findings below may reference images not displayed]



The usual time-out protocol was performed immediately prior to the
procedure.

Using mammographic guidance, sterile technique, 1% lidocaine and an
9-OM1 radioactive seed, the biopsy clip was localized using a
lateral approach. The follow-up mammogram images confirm the seed in
the expected location and were marked for the surgeon.

Follow-up survey of the patient confirms presence of the radioactive
seed.

Order number of 9-OM1 seed:  [id number].

Total activity:  0.251 millicuries reference Date: February 22, 2021

The patient tolerated the procedure well and was released from the
[REDACTED]. She was given instructions regarding seed removal.
IMPRESSION: Radioactive seed localization right breast. No apparent
complications.

## 2022-11-21 IMAGING — MG MM BREAST SURGICAL SPECIMEN
1 series · 2 of 2 positions shown · non-contrast
Comparison: Previous exam(s).

CLINICAL DATA: Evaluate surgical specimen following excision of
RIGHT breast ADH.

EXAM:
SPECIMEN RADIOGRAPH OF THE RIGHT BREAST

[Series 1: R · right · 0.07mm/px · 2 of 2 slices shown]
[im 1/2]
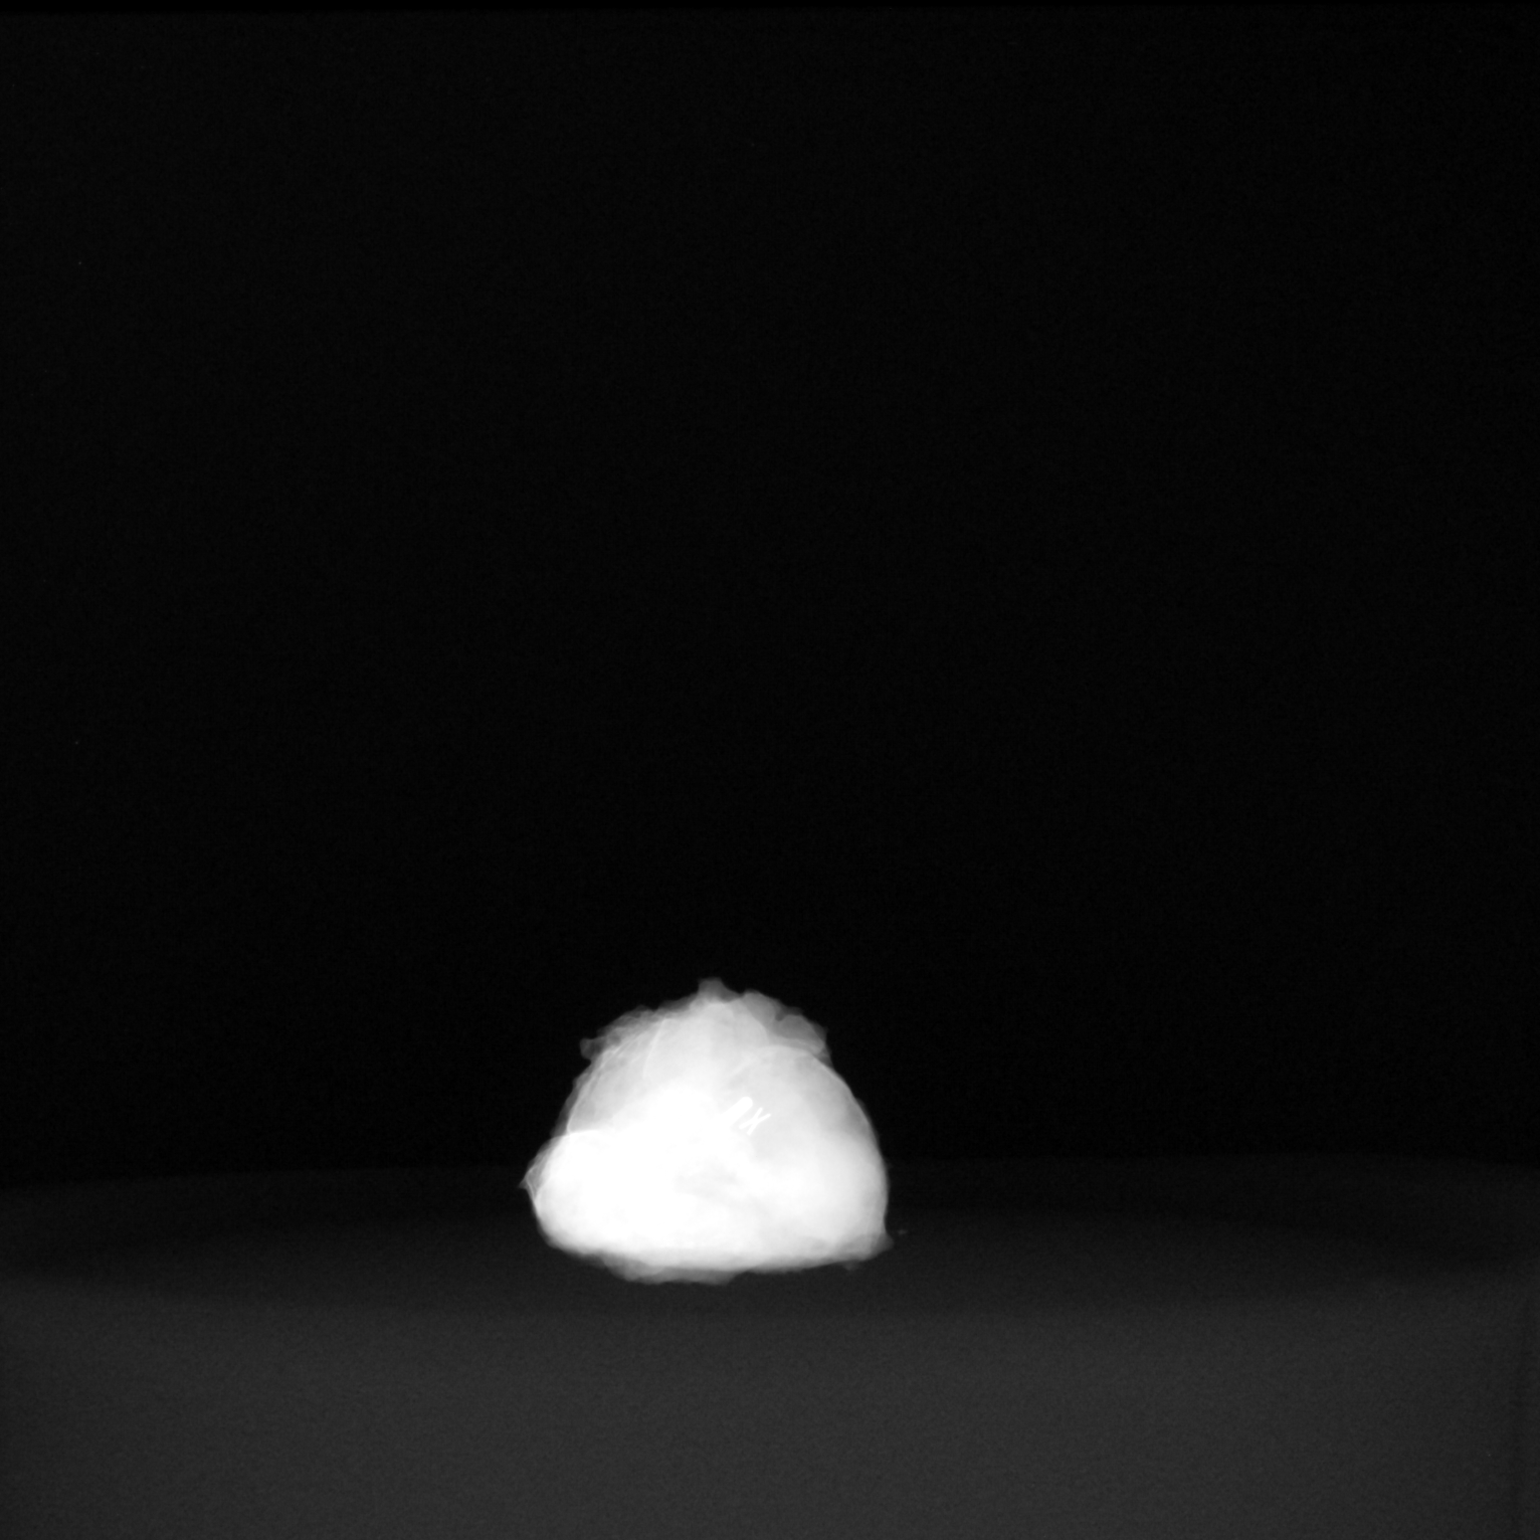
[im 2/2]
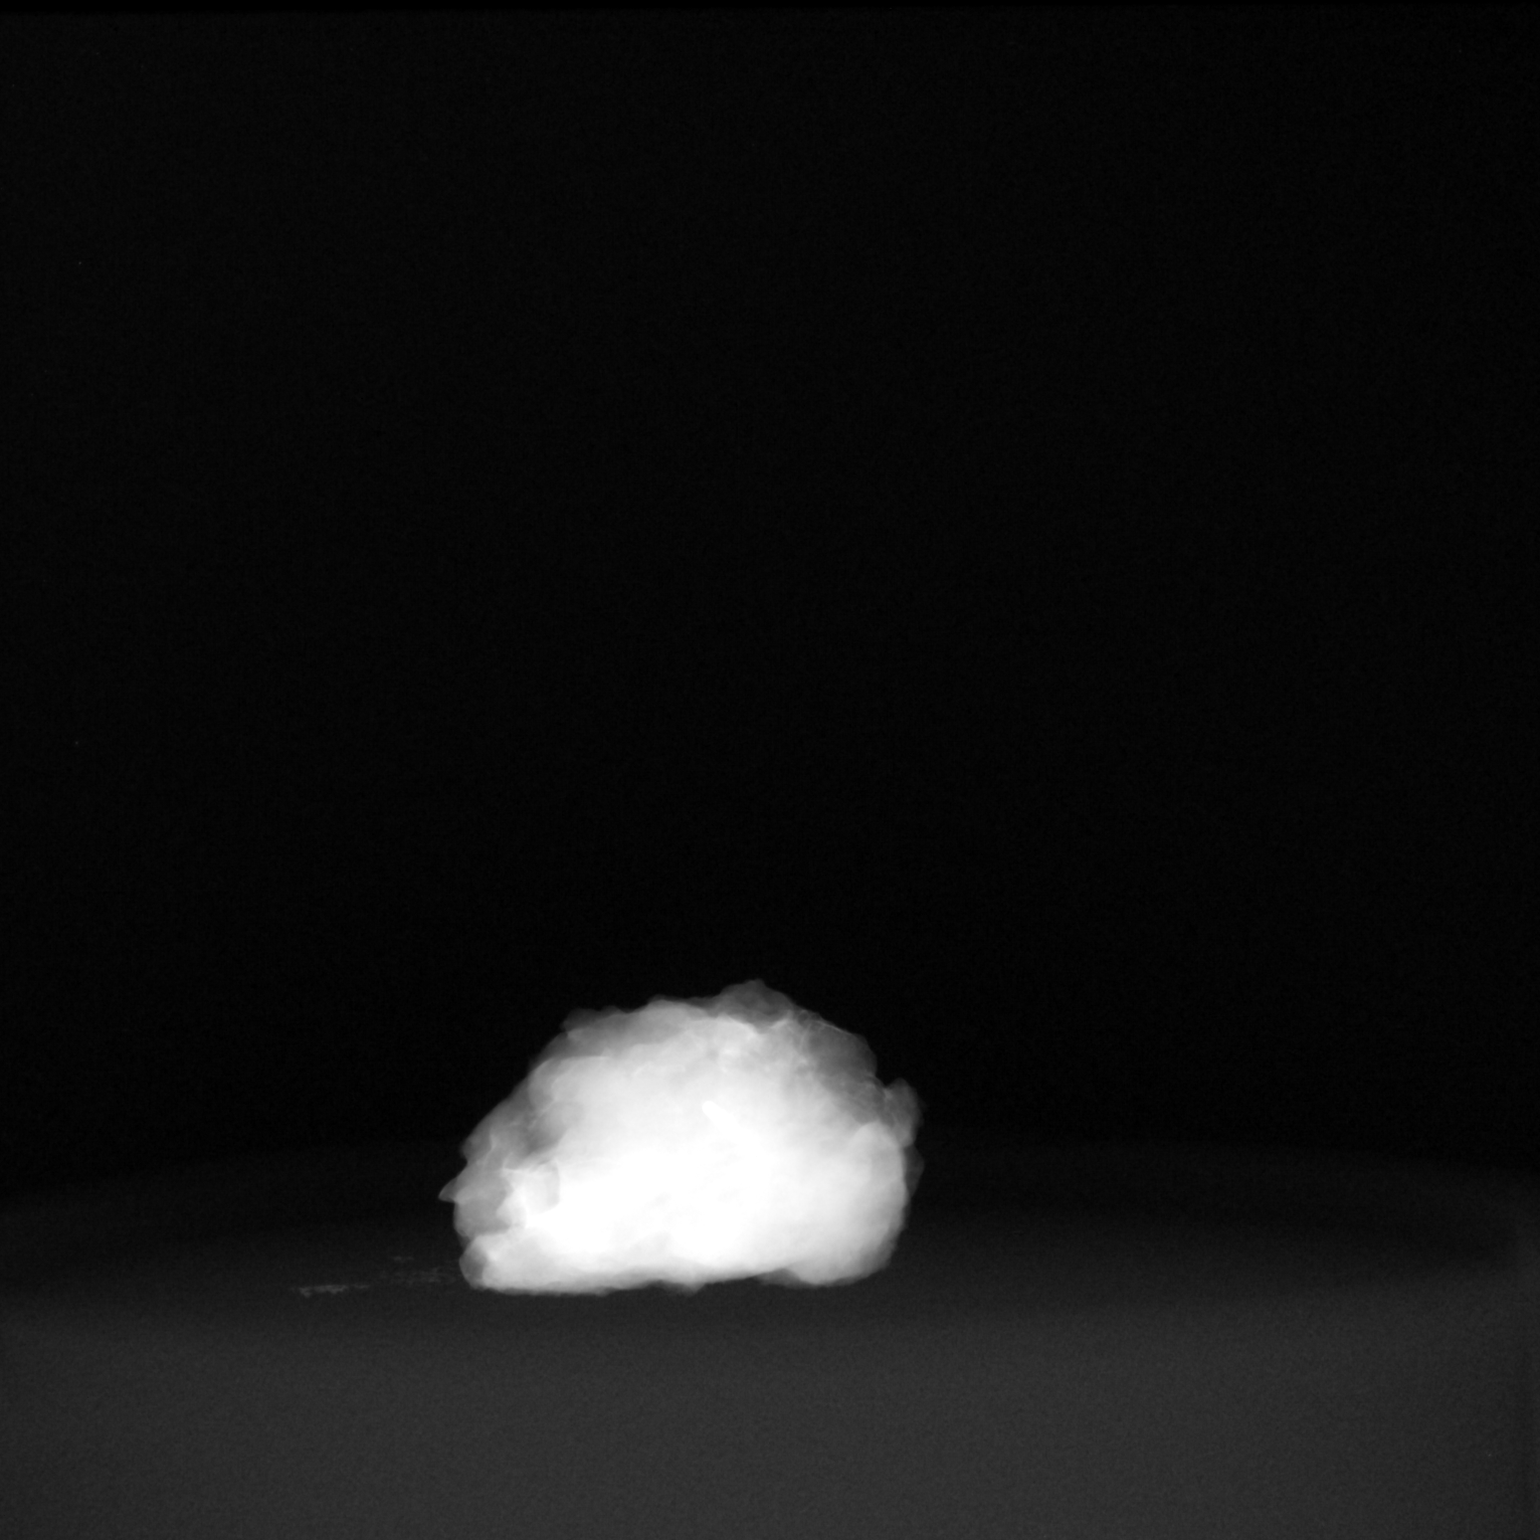

[2 of 2 positions shown; findings below may reference images not displayed]

FINDINGS: Status post excision of the RIGHT breast. The radioactive seed and X
biopsy marker clip are present and completely intact.
IMPRESSION: Specimen radiograph of the RIGHT breast.

## 2022-12-15 ENCOUNTER — Other Ambulatory Visit (HOSPITAL_COMMUNITY): Payer: Self-pay | Admitting: Family Medicine

## 2022-12-15 DIAGNOSIS — Z1231 Encounter for screening mammogram for malignant neoplasm of breast: Secondary | ICD-10-CM

## 2022-12-20 ENCOUNTER — Ambulatory Visit: Payer: Self-pay | Admitting: Family Medicine

## 2022-12-20 NOTE — Telephone Encounter (Signed)
3 call back attempts were made.  Encounter closed.

## 2022-12-20 NOTE — Telephone Encounter (Signed)
Caller disconnected prior to warm transfer.  Attempted to call back to triage report of shortness of breath.  No answer.  Left voicemail to return call.

## 2022-12-20 NOTE — Telephone Encounter (Signed)
Copied from CRM [id number]. Topic: Clinical - Red Word Triage >> Dec 20, 2022  8:13 AM Almira Coaster wrote: Red Word that prompted transfer to Nurse Triage: Patient called with symptoms of shortness of breath, headaches, cough and they have been lingering for over a month.

## 2023-01-03 ENCOUNTER — Other Ambulatory Visit (HOSPITAL_COMMUNITY): Payer: Self-pay | Admitting: Family Medicine

## 2023-01-03 DIAGNOSIS — Z9889 Other specified postprocedural states: Secondary | ICD-10-CM

## 2023-02-28 ENCOUNTER — Ambulatory Visit (HOSPITAL_COMMUNITY)
Admission: RE | Admit: 2023-02-28 | Discharge: 2023-02-28 | Disposition: A | Discharge status: Home / Self Care | Payer: 59 | Source: Ambulatory Visit | Attending: Family Medicine | Admitting: Family Medicine

## 2023-02-28 ENCOUNTER — Encounter (HOSPITAL_COMMUNITY): Payer: Self-pay

## 2023-02-28 DIAGNOSIS — Z9889 Other specified postprocedural states: Secondary | ICD-10-CM

## 2024-02-08 ENCOUNTER — Other Ambulatory Visit: Payer: Self-pay

## 2024-02-08 ENCOUNTER — Ambulatory Visit
Admission: EM | Admit: 2024-02-08 | Discharge: 2024-02-08 | Disposition: A | Discharge status: Home / Self Care | Attending: Nurse Practitioner | Admitting: Nurse Practitioner

## 2024-02-08 ENCOUNTER — Encounter: Payer: Self-pay | Admitting: Emergency Medicine

## 2024-02-08 DIAGNOSIS — J069 Acute upper respiratory infection, unspecified: Secondary | ICD-10-CM

## 2024-02-08 DIAGNOSIS — J209 Acute bronchitis, unspecified: Secondary | ICD-10-CM

## 2024-02-08 DIAGNOSIS — J029 Acute pharyngitis, unspecified: Secondary | ICD-10-CM

## 2024-02-08 MED ORDER — DEXAMETHASONE SOD PHOSPHATE PF 10 MG/ML IJ SOLN
10.0000 mg | Freq: Once | INTRAMUSCULAR | Status: AC
  Administered 2024-02-08: 10 mg via INTRAMUSCULAR

## 2024-02-08 MED ORDER — BENZONATATE 100 MG PO CAPS: 100.0000 mg | ORAL_CAPSULE | Freq: Three times a day (TID) | ORAL | 0 refills | Status: AC | PRN

## 2024-02-08 MED ORDER — IPRATROPIUM-ALBUTEROL 0.5-2.5 (3) MG/3ML IN SOLN
3.0000 mL | Freq: Once | RESPIRATORY_TRACT | Status: AC
  Administered 2024-02-08: 3 mL via RESPIRATORY_TRACT

## 2024-02-08 MED ORDER — ALBUTEROL SULFATE HFA 108 (90 BASE) MCG/ACT IN AERS: 1.0000 | INHALATION_SPRAY | Freq: Four times a day (QID) | RESPIRATORY_TRACT | 0 refills | Status: AC | PRN

## 2024-02-08 NOTE — ED Triage Notes (Signed)
 Pt reports cough, runny nose, body aches, headache, fatigue since Sunday. Has taken home covid/flu with negative result. Pt reports has tried otc medication with no change in symptoms.

## 2024-02-08 NOTE — Discharge Instructions (Signed)
 You have a viral upper respiratory infection that is causing inflammation in your lungs.  We gave you a breathing treatment today which helped some with lung inflammation.  Continue the albuterol  inhaler scheduled every 4-6 hours for the next 2 days then use as needed.  We also gave you a steroid injection today to help with the lung inflammation.  Symptoms should improve over the next week to 10 days.  If you develop chest pain or shortness of breath, go to the emergency room.  Some things that can make you feel better are: - Increased rest - Increasing fluid with water/sugar free electrolytes - Acetaminophen  and ibuprofen as needed for fever/pain - Salt water gargling, chloraseptic spray and throat lozenges - OTC guaifenesin (Mucinex) 600 mg twice daily for congestion - Saline sinus flushes or a neti pot - Humidifying the air -Tessalon  Perles every 8 hours as needed for dry cough

## 2024-02-08 NOTE — ED Provider Notes (Incomplete)
 " [facility]    CSN: [id number] Arrival date & time: 02/08/24  1232      History   Chief Complaint Chief Complaint  Patient presents with   Cough    HPI Sheryl Dominguez is a 54 y.o. female.   Patient presents today with 4-day history of bodyaches, dry and congested cough that is worse in the morning with yellow congestion, shortness of breath with activity, chest tightness with exertion, runny and stuffy nose, postnasal drainage, sore throat, headache, bilateral ear stopping up, nausea without vomiting, decreased appetite, and fatigue.  Reports temperature max 99 F.  No wheezing, vomiting, or diarrhea.  No known sick contacts.  Has taken over-the-counter medicine without much improvement in symptoms.  Patient reports history of similar-has had bronchitis in the past.  Reports she has never smoked and has never needed inhalers except when she is sick.  Patient reports irregular menstrual cycles due to perimenopause; reports no recent sexual activity and is confident that she is not pregnant.    Past Medical History:  Diagnosis Date   Arthritis    back and bil hip   Atypical ductal hyperplasia of right breast 01/2021   Bronchitis 11/2020   Pelvic fracture ([facility]) 01/25/1988    There are no active problems to display for this patient.   Past Surgical History:  Procedure Laterality Date   BREAST BIOPSY Right 11/19/2020   ATYPICAL DUCTAL HYPERPLASIA WITH CALCIFICATIONS- USUAL DUCTAL HYPERPLASIA AND FIBROCYSTIC CHANGES   BREAST EXCISIONAL BIOPSY Right 02/26/2021   Benign breast with proliferative fibrocystic changes including stromal  fibrosis, adenosis and epithelial hyperplasia without atypia   BREAST LUMPECTOMY WITH RADIOACTIVE SEED LOCALIZATION Right 02/26/2021   Procedure: RIGHT BREAST LUMPECTOMY WITH RADIOACTIVE SEED LOCALIZATION;  Surgeon: Curvin Deward MOULD, MD;  Location: [facility];  Service: General;  Laterality: Right;   CHOLECYSTECTOMY N/A  04/23/2014   Procedure: LAPAROSCOPIC CHOLECYSTECTOMY;  Surgeon: Oneil Budge Md, MD;  Location: [facility];  Service: General;  Laterality: N/A;   WISDOM TOOTH EXTRACTION      OB History   No obstetric history on file.      Home Medications    Prior to Admission medications  Medication Sig Start Date End Date Taking? Authorizing Provider  albuterol  (VENTOLIN  HFA) 108 (90 Base) MCG/ACT inhaler Inhale 1-2 puffs into the lungs every 6 (six) hours as needed for wheezing or shortness of breath. 02/08/24  Yes Chandra Raisin A, NP  benzonatate  (TESSALON ) 100 MG capsule Take 1-2 capsules (100-200 mg total) by mouth 3 (three) times daily as needed for cough. Do not take with alcohol or while operating or driving heavy machinery 8/84/73  Yes Chandra Raisin LABOR, NP  Multiple Vitamin (MULTIVITAMIN WITH MINERALS) TABS tablet Take 1 tablet by mouth daily.    [provider]    Family History Family History  Problem Relation Age of Onset   Breast cancer Paternal Grandmother     Social History Social History[1]   Allergies   Augmentin [amoxicillin-pot clavulanate]   Review of Systems Review of Systems Per HPI  Physical Exam Triage Vital Signs ED Triage Vitals  Encounter Vitals Group     BP 02/08/24 1240 106/77     Girls Systolic BP Percentile --      Girls Diastolic BP Percentile --      Boys Systolic BP Percentile --      Boys Diastolic BP Percentile --      Pulse Rate 02/08/24 1240 98  Resp 02/08/24 1240 20     Temp 02/08/24 1240 98.2 F (36.8 C)     Temp Source 02/08/24 1240 Oral     SpO2 02/08/24 1240 97 %     Weight --      Height --      Head Circumference --      Peak Flow --      Pain Score 02/08/24 1239 7     Pain Loc --      Pain Education --      Exclude from Growth Chart --    No data found.  Updated Vital Signs BP 106/77 (BP Location: Right Arm)   Pulse 98   Temp 98.2 F (36.8 C) (Oral)   Resp 20   LMP 01/09/2024 (Approximate)   SpO2 97%    Visual Acuity Right Eye Distance:   Left Eye Distance:   Bilateral Distance:    Right Eye Near:   Left Eye Near:    Bilateral Near:     Physical Exam Vitals and nursing note reviewed.  Constitutional:      General: She is not in acute distress.    Appearance: Normal appearance. She is not ill-appearing or toxic-appearing.  HENT:     Head: Normocephalic and atraumatic.     Right Ear: Tympanic membrane, ear canal and external ear normal.     Left Ear: Tympanic membrane, ear canal and external ear normal.     Nose: No congestion or rhinorrhea.     Mouth/Throat:     Mouth: Mucous membranes are moist.     Pharynx: Oropharynx is clear. No oropharyngeal exudate or posterior oropharyngeal erythema.  Eyes:     General: No scleral icterus.    Extraocular Movements: Extraocular movements intact.  Cardiovascular:     Rate and Rhythm: Normal rate and regular rhythm.  Pulmonary:     Effort: Pulmonary effort is normal. No respiratory distress.     Breath sounds: Wheezing present. No rhonchi or rales.     Comments: Frequent cough; wheezing noted at end of expiration Musculoskeletal:     Cervical back: Normal range of motion and neck supple.  Lymphadenopathy:     Cervical: No cervical adenopathy.  Skin:    General: Skin is warm and dry.     Coloration: Skin is not jaundiced or pale.     Findings: No erythema or rash.  Neurological:     Mental Status: She is alert and oriented to person, place, and time.  Psychiatric:        Behavior: Behavior is cooperative.      UC Treatments / Results  Labs (all labs ordered are listed, but only abnormal results are displayed) Labs Reviewed - No data to display  EKG   Radiology No results found.  Procedures Procedures (including critical care time)  Medications Ordered in UC Medications  ipratropium-albuterol  (DUONEB) 0.5-2.5 (3) MG/3ML nebulizer solution 3 mL (3 mLs Nebulization Given 02/08/24 1251)  dexamethasone  (DECADRON )  injection 10 mg (10 mg Intramuscular Given 02/08/24 1319)    Initial Impression / Assessment and Plan / UC Course  I have reviewed the triage vital signs and the nursing notes.  Pertinent labs & imaging results that were available during my care of the patient were reviewed by me and considered in my medical decision making (see chart for details).   Patient is a pleasant, well-appearing 54 year old female presenting today for cough and chest tightness.  Vital signs are stable in triage.  Viral testing  deferred given length of symptoms today.  On exam, she has diffuse wheezing at the end of expiration, likely inducing coughing.  DuoNeb was given with improvement in coughing and wheezing.  Treat for viral bronchitis with albuterol  inhaler scheduled every 4-6 hours for 2 days, then as needed.  Decadron  10 mg IM given in urgent care today for lung inflammation.  Other supportive care discussed including cough suppressant medication.  ER and return precautions discussed.  The patient was given the opportunity to ask questions.  All questions answered to their satisfaction.  The patient is in agreement to this plan.   Final Clinical Impressions(s) / UC Diagnoses   Final diagnoses:  Acute bronchitis, unspecified organism  Viral URI with cough  Acute pharyngitis, unspecified etiology     Discharge Instructions      You have a viral upper respiratory infection that is causing inflammation in your lungs.  We gave you a breathing treatment today which helped some with lung inflammation.  Continue the albuterol  inhaler scheduled every 4-6 hours for the next 2 days then use as needed.  We also gave you a steroid injection today to help with the lung inflammation.  Symptoms should improve over the next week to 10 days.  If you develop chest pain or shortness of breath, go to the emergency room.  Some things that can make you feel better are: - Increased rest - Increasing fluid with water/sugar free  electrolytes - Acetaminophen  and ibuprofen as needed for fever/pain - Salt water gargling, chloraseptic spray and throat lozenges - OTC guaifenesin (Mucinex) 600 mg twice daily for congestion - Saline sinus flushes or a neti pot - Humidifying the air -Tessalon  Perles every 8 hours as needed for dry cough      ED Prescriptions     Medication Sig Dispense Auth. Provider   benzonatate  (TESSALON ) 100 MG capsule Take 1-2 capsules (100-200 mg total) by mouth 3 (three) times daily as needed for cough. Do not take with alcohol or while operating or driving heavy machinery 30 capsule Chandra Raisin A, NP   albuterol  (VENTOLIN  HFA) 108 (90 Base) MCG/ACT inhaler Inhale 1-2 puffs into the lungs every 6 (six) hours as needed for wheezing or shortness of breath. 18 g Chandra Raisin LABOR, NP      PDMP not reviewed this encounter.    Chandra Raisin LABOR, NP 02/09/24 1132     [1]  Social History Tobacco Use   Smoking status: Never  Substance Use Topics   Alcohol use: Yes    Comment: occ   Drug use: No     Chandra Raisin LABOR, NP 02/09/24 1133  "
